# Patient Record
Sex: Female | Born: 1965 | Race: White | Hispanic: No | Marital: Married | Smoking: Never smoker
Health system: Southern US, Community
[De-identification: ages and names within clinical notes are randomized; demographics above are authoritative.]

## PROBLEM LIST (undated history)

## (undated) HISTORY — PX: OTHER SURGICAL HISTORY: SHX169

---

## 2000-09-01 ENCOUNTER — Other Ambulatory Visit: Admission: RE | Admit: 2000-09-01 | Discharge: 2000-09-01 | Payer: Self-pay | Admitting: Gastroenterology

## 2000-09-01 ENCOUNTER — Encounter (INDEPENDENT_AMBULATORY_CARE_PROVIDER_SITE_OTHER): Payer: Self-pay | Admitting: Specialist

## 2001-07-21 ENCOUNTER — Ambulatory Visit (HOSPITAL_COMMUNITY): Admission: RE | Admit: 2001-07-21 | Discharge: 2001-07-21 | Payer: Self-pay | Admitting: Obstetrics and Gynecology

## 2001-07-21 ENCOUNTER — Encounter: Payer: Self-pay | Admitting: Obstetrics and Gynecology

## 2004-02-17 ENCOUNTER — Emergency Department (HOSPITAL_COMMUNITY): Admission: EM | Admit: 2004-02-17 | Discharge: 2004-02-17 | Payer: Self-pay | Admitting: Emergency Medicine

## 2005-08-18 ENCOUNTER — Ambulatory Visit (HOSPITAL_COMMUNITY): Admission: RE | Admit: 2005-08-18 | Discharge: 2005-08-18 | Payer: Self-pay | Admitting: Obstetrics and Gynecology

## 2006-11-11 ENCOUNTER — Ambulatory Visit (HOSPITAL_COMMUNITY): Admission: RE | Admit: 2006-11-11 | Discharge: 2006-11-11 | Payer: Self-pay | Admitting: Obstetrics and Gynecology

## 2008-01-20 ENCOUNTER — Other Ambulatory Visit: Admission: RE | Admit: 2008-01-20 | Discharge: 2008-01-20 | Payer: Self-pay | Admitting: Obstetrics and Gynecology

## 2008-01-24 ENCOUNTER — Ambulatory Visit (HOSPITAL_COMMUNITY): Admission: RE | Admit: 2008-01-24 | Discharge: 2008-01-24 | Payer: Self-pay | Admitting: Obstetrics and Gynecology

## 2008-05-23 ENCOUNTER — Ambulatory Visit (HOSPITAL_COMMUNITY): Admission: RE | Admit: 2008-05-23 | Discharge: 2008-05-23 | Payer: Self-pay | Admitting: Obstetrics & Gynecology

## 2008-07-04 ENCOUNTER — Ambulatory Visit (HOSPITAL_COMMUNITY): Admission: RE | Admit: 2008-07-04 | Discharge: 2008-07-04 | Payer: Self-pay | Admitting: Obstetrics & Gynecology

## 2008-10-05 ENCOUNTER — Encounter: Payer: Self-pay | Admitting: Obstetrics & Gynecology

## 2008-10-05 ENCOUNTER — Ambulatory Visit: Payer: Self-pay | Admitting: Obstetrics and Gynecology

## 2008-10-05 ENCOUNTER — Inpatient Hospital Stay (HOSPITAL_COMMUNITY): Admission: RE | Admit: 2008-10-05 | Discharge: 2008-10-07 | Payer: Self-pay | Admitting: Obstetrics & Gynecology

## 2010-03-11 ENCOUNTER — Encounter: Payer: Self-pay | Admitting: Obstetrics & Gynecology

## 2010-05-25 LAB — URINALYSIS, ROUTINE W REFLEX MICROSCOPIC
Glucose, UA: NEGATIVE mg/dL
pH: 7 (ref 5.0–8.0)

## 2010-05-25 LAB — BASIC METABOLIC PANEL
CO2: 22 mEq/L (ref 19–32)
Chloride: 110 mEq/L (ref 96–112)
GFR calc non Af Amer: >60 mL/min (ref >60)
Glucose, Bld: 119 mg/dL — ABNORMAL HIGH (ref 70–99)
Potassium: 4.2 mEq/L (ref 3.5–5.1)
Sodium: 140 mEq/L (ref 135–145)

## 2010-05-25 LAB — CBC
HCT: 31 % — ABNORMAL LOW (ref 36.0–46.0)
HCT: 39.5 % (ref 36.0–46.0)
Hemoglobin: 10.4 g/dL — ABNORMAL LOW (ref 12.0–15.0)
Hemoglobin: 13.1 g/dL (ref 12.0–15.0)
RBC: 3.51 MIL/uL — ABNORMAL LOW (ref 3.87–5.11)
RBC: 4.42 MIL/uL (ref 3.87–5.11)
RDW: 14.4 % (ref 11.5–15.5)
WBC: 8.7 10*3/uL (ref 4.0–10.5)

## 2010-06-08 IMAGING — US US OB DETAIL+14 WK
1 series · 14 of 28 positions shown · non-contrast
Comparison: none

OBSTETRICAL ULTRASOUND:
 This ultrasound was performed in The [HOSPITAL], and the [facility] report will be stored to [REDACTED] PACS.

[Series 1: us ob detail+14 wk · 14 of 108 slices shown]
[im 4/108]
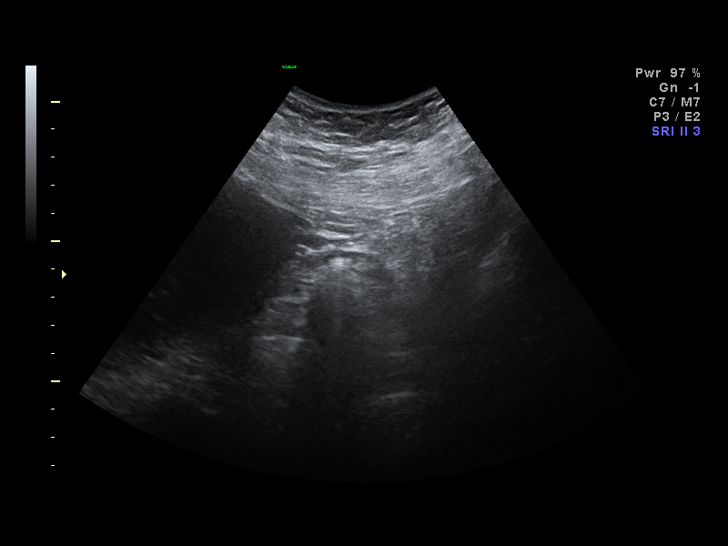
[im 12/108]
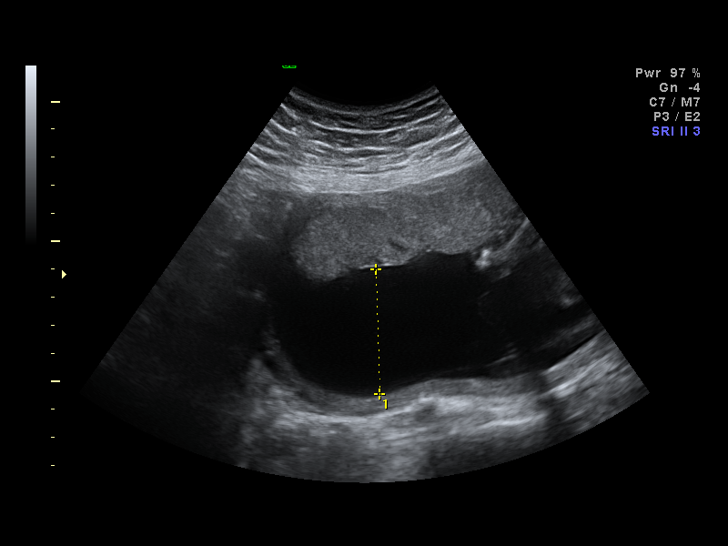
[im 20/108]
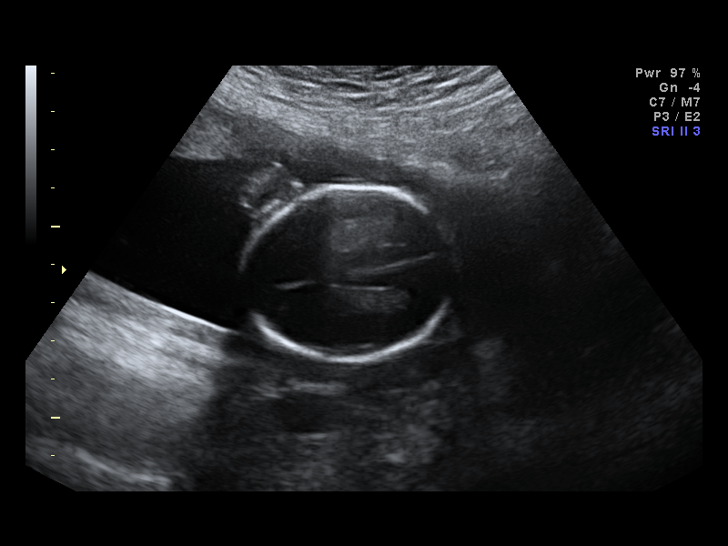
[im 28/108]
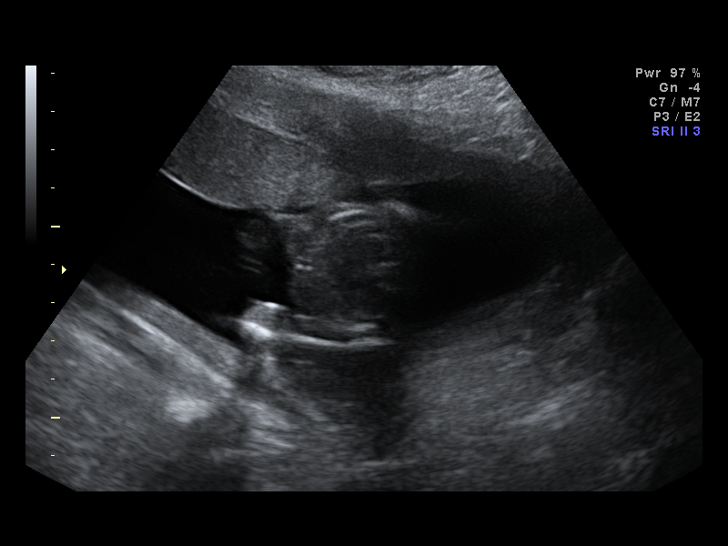
[im 36/108]
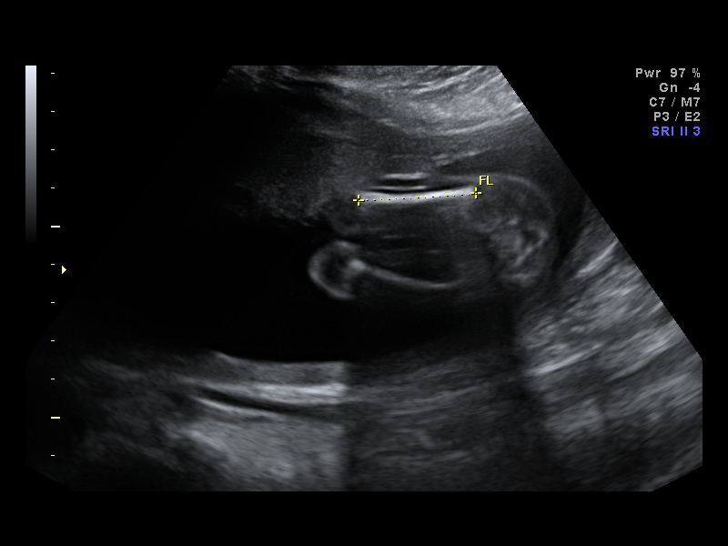
[im 44/108]
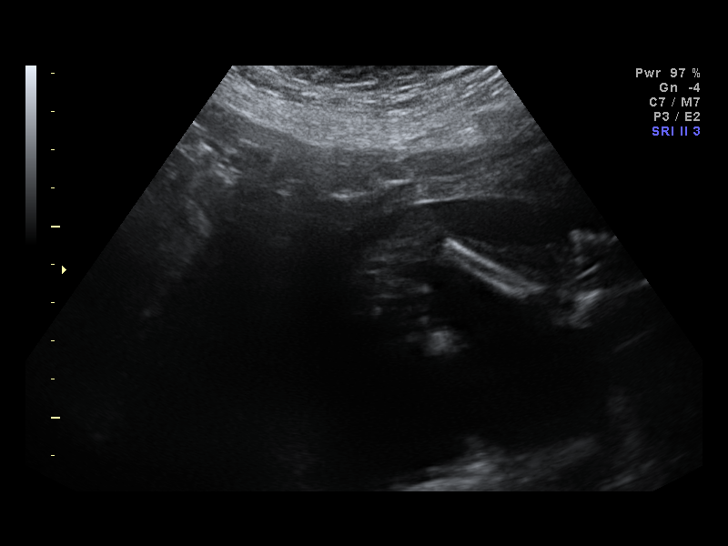
[im 52/108]
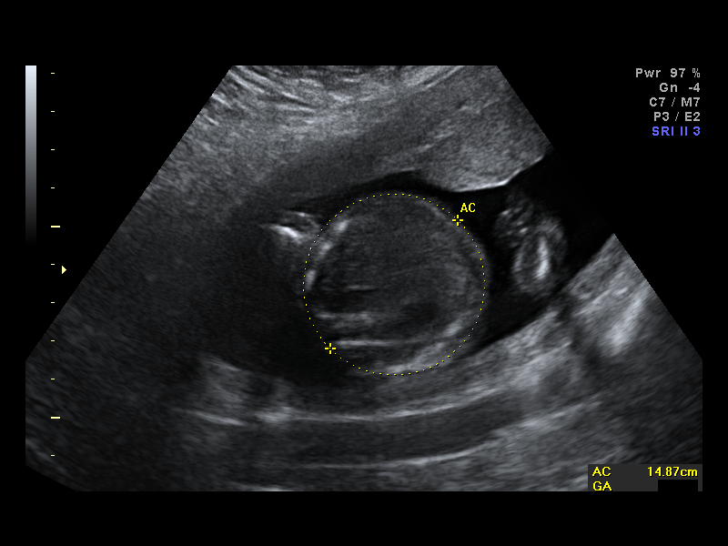
[im 60/108]
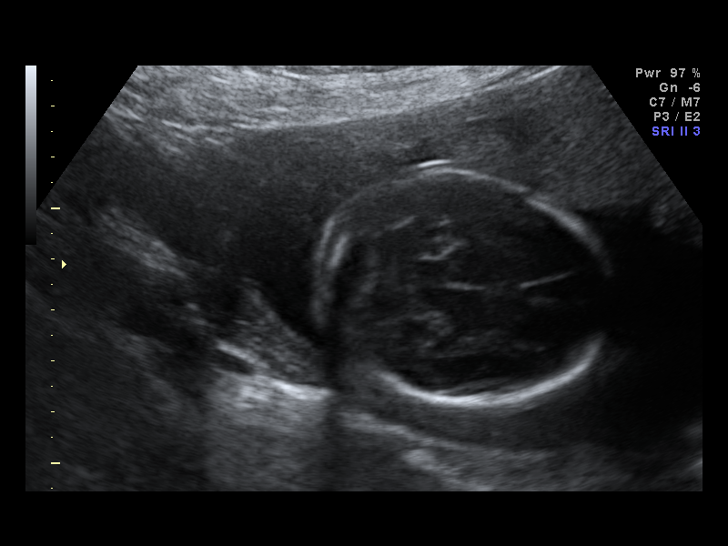
[im 68/108]
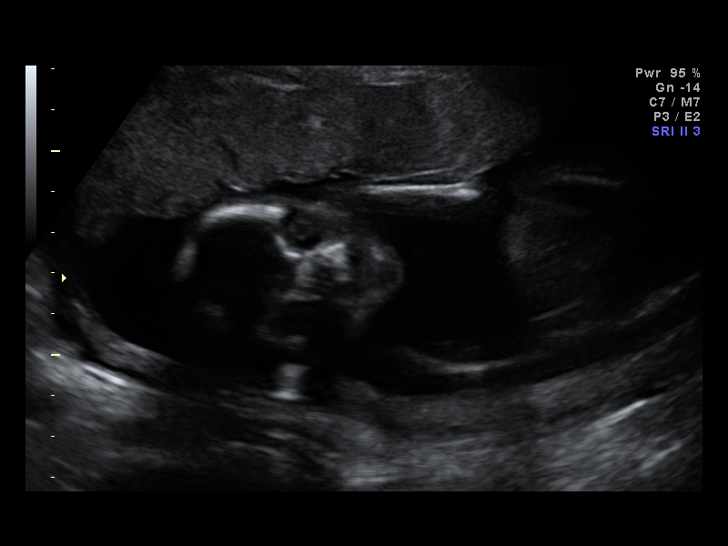
[im 76/108]
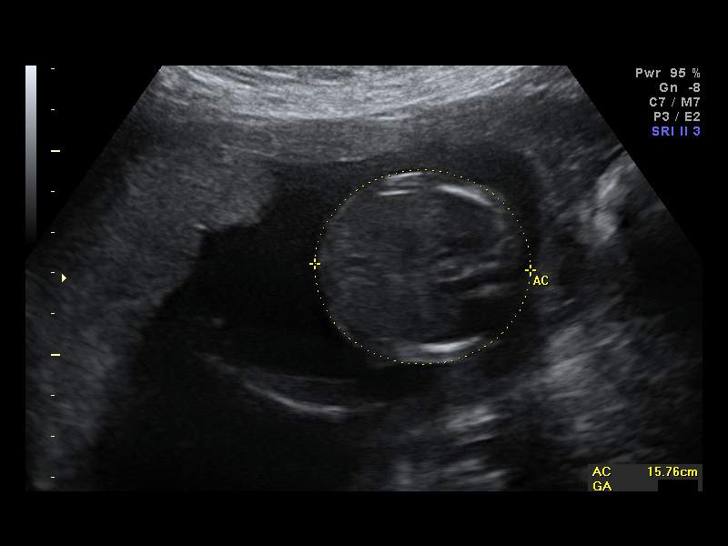
[im 84/108]
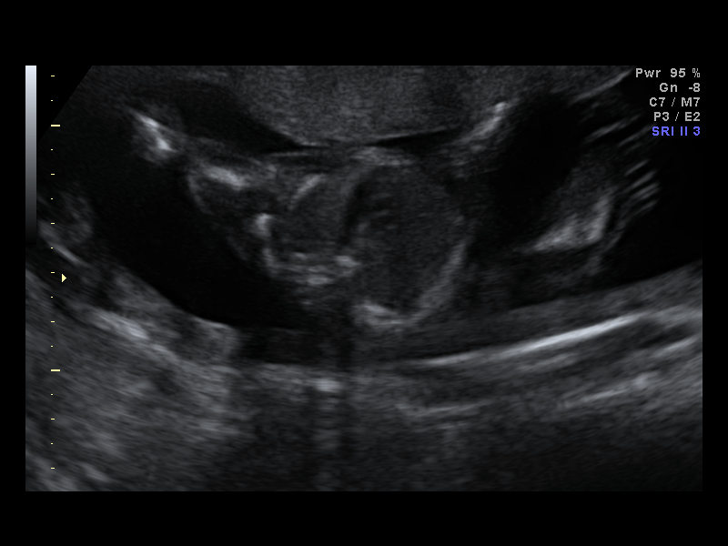
[im 92/108]
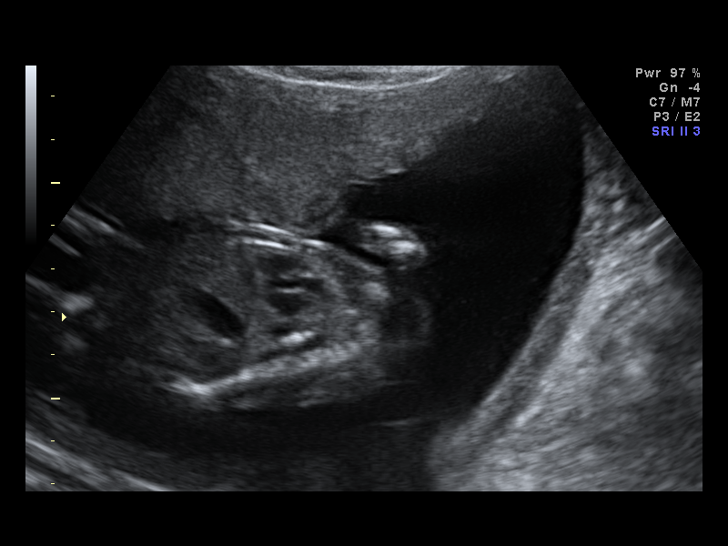
[im 100/108]
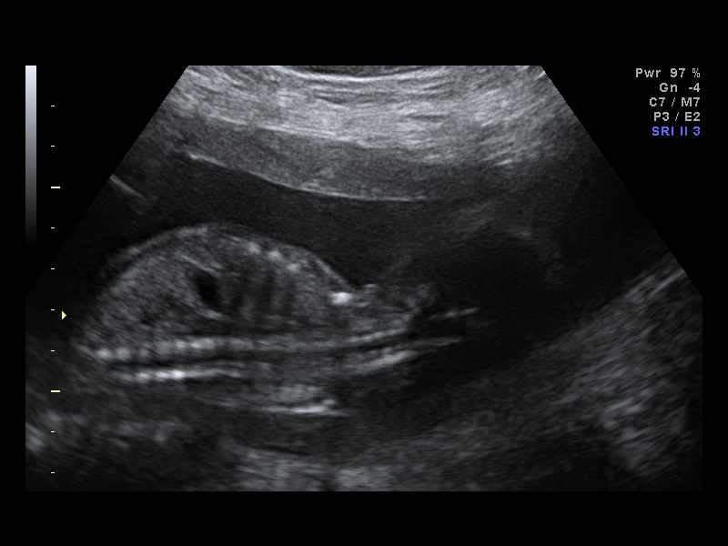
[im 108/108]
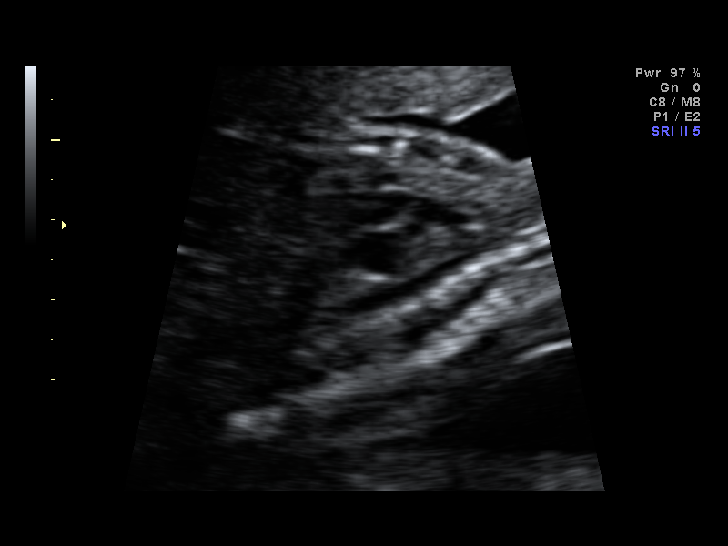

[14 of 28 positions shown; findings below may reference images not displayed]

IMPRESSION: [facility] has also been faxed to the ordering physician.

## 2010-07-02 NOTE — Op Note (Signed)
[name], ROSTAD              ACCOUNT NO.:  [id number]   MEDICAL RECORD NO.:  [id number]          PATIENT TYPE:  INP   LOCATION:  9129                          FACILITY:  [facility]   PHYSICIAN:  Lazaro Arms, M.D.   DATE OF BIRTH:  Jul 07, 1965   DATE OF PROCEDURE:  10/05/2008  DATE OF DISCHARGE:                               OPERATIVE REPORT   PREOPERATIVE DIAGNOSES:  1. Intrauterine pregnancy at 63 weeks' gestation.  2. Previous cesarean section x2.  3. Desires sterilization.  4. Class A1 diabetes mellitus.  5. Crohn disease.   POSTOPERATIVE DIAGNOSES:  1. Intrauterine pregnancy at 71 weeks' gestation.  2. Previous cesarean section x2.  3. Desires sterilization.  4. Class A1 diabetes mellitus.  5. Crohn disease.   PROCEDURES:  Repeat cesarean section and modified Pomeroy bilateral  ligation.   SURGEON:  Lazaro Arms, MD   ANESTHESIA:  Spinal.   FINDINGS:  Over low-transverse hysterotomy incision, delivered a viable  female at 74 with Apgars of 9 and 9 with pH 7.20.  She did have a lot of  adhesions of the uterus.  The anterior wall and the bladder was a very  high up on the lower uterine segment, it was dissected down without  difficulty, and the infant underwent a vacuum-assisted extraction for  delivery.  Cord blood and cord gas sent to Pathology.   DESCRIPTION OF OPERATION:  The patient was taken to the operating room,  placed in sitting position, underwent spinal anesthetic, prepped and  draped in usual sterile fashion.  A Foley catheter was placed.  Pfannenstiel skin incision was made, carried down sharply to the rectus  fascia, which was scored in midline, extended laterally.  The fascia  taken off the muscle superiorly and inferiorly without difficulty.  The  muscles were divided.  Peritoneal cavity was entered.  I had to dissect  the uterus off the perineum with the anterior abdominal wall and also  dissect the bladder down off the lower uterine segment and  anterior  abdominal wall, I had to make high uterine incision, did not have really  any development at all.  Vacuum extractor was used to facilitate  delivery.  The infant underwent routine neonatal resuscitation.  Cord  blood was sent.  Cord gas 7.20.  Placenta was delivered spontaneously.  The uterus was exteriorized, closed in 2 layers, first being a running  interlocking layer and the second being imbricating layer.  There was  good hemostasis.  A modified Pomeroy bilateral tubal ligation was  performed bilaterally and 2-cm segments were removed bilaterally with  good hemostasis.  The uterus was replaced in the peritoneal cavity.  The  muscles were reapproximated loosely.  The fascia closed using 0-Vicryl  running.  Skin was closed using skin staples.  The patient tolerated the  procedure well.  She experienced 500 mL of blood loss, taken recovery  room in stable condition.  All counts correct x3.      Lazaro Arms, M.D.  Electronically Signed     LHE/MEDQ  D:  10/05/2008  T:  10/05/2008  Job:  [id number] 

## 2010-07-02 NOTE — H&P (Signed)
[name], [name]              ACCOUNT NO.:  [id number]   MEDICAL RECORD NO.:  [id number]          PATIENT TYPE:  INP   LOCATION:  9129                          FACILITY:  [facility]   PHYSICIAN:  Lazaro Arms, M.D.   DATE OF BIRTH:  January 07, 1966   DATE OF ADMISSION:  10/05/2008  DATE OF DISCHARGE:                              HISTORY & PHYSICAL   HISTORY OF PRESENT ILLNESS:  The patient is a 45 year old white female,  gravida 3, para 2, estimated date of delivery of October 12, 2008,  currently at 65 weeks' gestation with class A2 diabetes mellitus,  advanced sternal age and desires sterilization.  She is admitted for  repeat cesarean section and bilateral tubal ligation.  The pregnancy  besides her diabetes has been uncomplicated and she has had good blood  sugar control for the most part and has actually had minimal weight gain  when she got on her diet.   PAST MEDICAL HISTORY:  Significant for ulcerative colitis.   PAST SURGICAL HISTORY:  C-section x2.  She has some plates and screws  for traumatic tib-fib fracture   PAST OB HISTORY:  C-section x2.   ALLERGIES:  PENICILLIN.   MEDICATION:  Sulfasalazine.   REVIEW OF SYSTEMS:  Otherwise negative.   PHYSICAL EXAMINATION:  HEENT: Unremarkable.  THYROID:  Normal.  LUNGS:  Clear.  HEART:  Regular rhythm.  No murmurs, rub, or gallop.  BREASTS:  Deferred.  ABDOMEN:  Fundal height of 38 cm.  Cervix long, thick, and closed.  EXTREMITIES:  Warm.  No edema.  NEUROLOGIC:  Grossly intact.   Blood type is A+.  Rubella is immune.  Varicella is immune.  Hepatitis B  was negative.  HIV is negative x2.  HSV-2 is negative.  Serology is  negative x2.  Pap was normal.  GC and Chlamydia was negative x2.  Her  group B strep was negative.  Her 3-hour GTT was abnormal.   IMPRESSION:  1. Intrauterine pregnancy, [redacted] weeks gestation.  2. Previous cesarean section x2.  3. Desires sterilization.  4. Class A1 diabetes mellitus.  5. Ulcerative  colitis.   PLAN:  The patient is admitted for repeat cesarean section and tubal  ligation.  Understands the risks, benefits, indications, alternatives,  will proceed.      Lazaro Arms, M.D.  Electronically Signed     LHE/MEDQ  D:  10/05/2008  T:  10/06/2008  Job:  [id number]

## 2010-07-05 NOTE — Discharge Summary (Signed)
[name], [name]              ACCOUNT NO.:  [id number]   MEDICAL RECORD NO.:  [id number]          PATIENT TYPE:  INP   LOCATION:  9129                          [facility]ACILITY:  [facility]   PHYSICIAN:  Tilda Burrow, M.D. DATE OF BIRTH:  Feb 03, 1966   DATE OF ADMISSION:  10/05/2008  DATE OF DISCHARGE:  10/07/2008                               DISCHARGE SUMMARY   PRIMARY CARE PHYSICIAN:  OB/GYN services through [facility].   DISCHARGE DIAGNOSES:  1. Intrauterine pregnancy at 27 weeks' gestation.  2. Previous cesarean section x2.  3. Desire for sterilization.  4. Class A1 diabetes mellitus.  5. Ulcerative colitis.   DISCHARGE MEDICATIONS:  Sulfasalazine.   CONSULTANTS:  None.   PROCEDURES:  Repeat C-section and modified Pomeroy bilateral ligation.   LABORATORY DATA:  1. CBC, October 04, 2008:  White count 8.7, hemoglobin 13.1, hematocrit      39.5, platelet count 180.  2. Urinalysis, October 04, 2008, showing yellow color, clear      appearance.  Specific gravity 1.020, pH of 7,  urine glucose      negative, bilirubin negative, ketone negative, blood negative,      protein negative, urobilinogen at 0.2, nitrite negative, leukocyte      negative.  3. BMET, October 04, 2008, showing a sodium 140, potassium 4.2,      chloride 110, CO2 22, BUN of 5, creatinine 0.68, blood glucose of      119, calcium of 8.9.  4. RPR October 04, 2008, nonreactive.  5. CBC, October 06, 2008, showing a white count of 10.1, hemoglobin of      10.4, hematocrit 31, platelet count of 153.   BRIEF HOSPITAL COURSE:  Briefly this is a 45 year old G3, P2, coming in  at 79 weeks' gestation with class A2 diabetes as well as advanced  maternal age coming in for C-section as well as tubal ligation.  1. Intrauterine pregnancy. The patient delivered a viable female at 0913      hours on August 19 via lower transverse hysterotomy incision with      Apgars of 9 and 9 with a pH of 7.2.  Delivery was performed via      vacuum  extractor assistance  The fascial incision was closed via 0      Vicryl with the skin being closed with skin staples.  The patient      experienced about 500 ml of blood loss.  The patient is being taken      to the recovery room in stable condition.  Mom will be bottle-      feeding/  For contraception mother received a bilateral tubal      ligation during this hospitalization.  GBS negative.  Blood type      A+, rubella immune.  Varicella immune.  Hepatitis B negative, HIV      negative x2.  HSV negative.  Serology negative x2.  GC/Chlamydia      negative x2.  Group B Strep negative.  2. Ulcerative colitis.  The patient was continued on sulfasalazine      throughout hospitalization with  no reports of ulcerative colitis or      inflammatory bowel disease type symptomatology or related abdominal      pain during this hospitalization.   DISCHARGE INSTRUCTIONS:  1. The patient is discharged home with routine care.  Skin staples      were removed on the day of discharge.  Routine discharge      instructions.  2. Follow-up appointments.  The patient will return to [facility] in      4-6 weeks for routine postpartum follow-up.   CONDITION ON DISCHARGE:  The patient is discharged home with infant in  stable medical condition.      Doree Albee, MD      Tilda Burrow, M.D.  Electronically Signed    SN/MEDQ  D:  10/08/2008  T:  10/08/2008  Job:  [id number]

## 2010-07-05 NOTE — Group Therapy Note (Signed)
[name], [name]              ACCOUNT NO.:  [id number]   MEDICAL RECORD NO.:  [id number]          PATIENT TYPE:  EMS   LOCATION:  ED                            FACILITY:  [facility]   PHYSICIAN:  Kingsley Callander. Ouida Sills, MD       DATE OF BIRTH:  [birth year]   DATE OF PROCEDURE:  02/18/2004  DATE OF DISCHARGE:  02/17/2004                                   PROGRESS NOTE   This patient is a 45 year old white female who presented with a sore throat,  hoarseness and discomfort on swallowing.  She had been seen in the emergency  room yesterday and had been treated with Zithromax and hydrocodone.  She had  been called in viscous lidocaine earlier today to help relieve her symptoms.  She has a history of allergies and asthma, treated with Pulmicort.   HEENT:  On exam her pharynx appears normal.  The nose is mildly congested.  TMs are normal.  NECK:  No cervical adenopathy.  LUNGS:  Clear.  No wheezes.  HEART:  Regular at 108.  RESPIRATORY:  She is breathing comfortably and speaking with a hoarse voice.   IMPRESSION:  Upper respiratory infection.  She does not appear to require  admission at this point.  I believe she can continue with her oral  outpatient therapy.  She has had difficulty sleeping, so Ambien 10 mg q.h.s.  will be added.  She was reassured that with her ability to breathe this well  and handle her secretions this well, she does not require IV therapy.  Her  oxygen saturation was normal yesterday at 99%.  She does not show evidence  of stridor or possible epiglottitis.     Channing Mutters   ROF/MEDQ  D:  02/18/2004  T:  02/18/2004  Job:  [id number]

## 2010-07-25 ENCOUNTER — Other Ambulatory Visit (HOSPITAL_COMMUNITY): Payer: Self-pay | Admitting: Pediatrics

## 2010-07-29 ENCOUNTER — Ambulatory Visit (HOSPITAL_COMMUNITY)
Admission: RE | Admit: 2010-07-29 | Discharge: 2010-07-29 | Disposition: A | Discharge status: Home / Self Care | Payer: BC Managed Care – PPO | Source: Ambulatory Visit | Attending: Pediatrics | Admitting: Pediatrics

## 2010-07-29 DIAGNOSIS — R222 Localized swelling, mass and lump, trunk: Secondary | ICD-10-CM | POA: Insufficient documentation

## 2010-08-12 ENCOUNTER — Telehealth: Payer: Self-pay | Admitting: *Deleted

## 2010-08-12 NOTE — Telephone Encounter (Signed)
Wrong pt

## 2010-08-22 ENCOUNTER — Other Ambulatory Visit: Payer: Self-pay | Admitting: Adult Health

## 2010-08-22 ENCOUNTER — Other Ambulatory Visit (HOSPITAL_COMMUNITY)
Admission: RE | Admit: 2010-08-22 | Discharge: 2010-08-22 | Disposition: A | Discharge status: Home / Self Care | Payer: BC Managed Care – PPO | Source: Ambulatory Visit | Attending: Obstetrics and Gynecology | Admitting: Obstetrics and Gynecology

## 2010-08-22 DIAGNOSIS — Z1159 Encounter for screening for other viral diseases: Secondary | ICD-10-CM | POA: Insufficient documentation

## 2010-08-22 DIAGNOSIS — Z01419 Encounter for gynecological examination (general) (routine) without abnormal findings: Secondary | ICD-10-CM | POA: Insufficient documentation

## 2010-12-25 ENCOUNTER — Encounter: Payer: Self-pay | Admitting: Nephrology

## 2011-01-07 ENCOUNTER — Other Ambulatory Visit (INDEPENDENT_AMBULATORY_CARE_PROVIDER_SITE_OTHER): Payer: Self-pay | Admitting: Internal Medicine

## 2011-01-21 NOTE — Telephone Encounter (Signed)
Per Dr. Karilyn Cota, this is not our patient. DMM

## 2011-01-21 NOTE — Telephone Encounter (Signed)
We do not  have any records on this patient. I spoke with pharmacist and patient had a prescription filled by Ms. Lowell Guitar from [facility]

## 2011-01-21 NOTE — Telephone Encounter (Signed)
Needs OV. I am unable to locate chart

## 2011-02-14 ENCOUNTER — Encounter (INDEPENDENT_AMBULATORY_CARE_PROVIDER_SITE_OTHER): Payer: Self-pay | Admitting: *Deleted

## 2011-02-26 ENCOUNTER — Ambulatory Visit (INDEPENDENT_AMBULATORY_CARE_PROVIDER_SITE_OTHER): Payer: Self-pay | Admitting: Internal Medicine

## 2011-02-26 ENCOUNTER — Telehealth (INDEPENDENT_AMBULATORY_CARE_PROVIDER_SITE_OTHER): Payer: Self-pay | Admitting: *Deleted

## 2011-02-26 ENCOUNTER — Encounter (INDEPENDENT_AMBULATORY_CARE_PROVIDER_SITE_OTHER): Payer: Self-pay | Admitting: Internal Medicine

## 2011-02-26 ENCOUNTER — Other Ambulatory Visit (INDEPENDENT_AMBULATORY_CARE_PROVIDER_SITE_OTHER): Payer: Self-pay | Admitting: *Deleted

## 2011-02-26 VITALS — BP 124/80 | HR 80 | Temp 98.4°F | Ht 64.0 in | Wt 221.0 lb

## 2011-02-26 DIAGNOSIS — K5120 Ulcerative (chronic) proctitis without complications: Secondary | ICD-10-CM

## 2011-02-26 NOTE — Patient Instructions (Signed)
Colonoscopy with Dr. Rehman 

## 2011-02-26 NOTE — Telephone Encounter (Signed)
Patient needs movi prep 

## 2011-02-26 NOTE — Progress Notes (Signed)
Subjective:     Patient ID: [name], female   DOB: 06-26-1965, 46 y.o.   MRN: [id number]  HPI [name] is a 46 yr old female here today for f/u of her UC. She was diagnosed with 2003 with UC on colonoscopy by Dr. Corinda Gubler. She had bloody stools and crampy abdominal pain and mucous.  Her last UC flare was this fall which was minor. She did have blood in her stool.  She was seen in the [place] office for her UC. Appetite is good. No unintentional weight loss.  No abdominal pain. No melena. She occasionally has blood in her stool.  02/05/2011 11.7 and 37.4. Review of Systems see hpi Current Outpatient Prescriptions  Medication Sig Dispense Refill  . sulfaSALAzine (AZULFIDINE) 500 MG tablet TAKE 2 TABLETS THREE TIMES A DAY  180 tablet  10   Past Medical History  Diagnosis Date  . Ulcerative colitis     diagnosed [id number]   Past Surgical History  Procedure Date  . Bladder repair w/ cesarean section     x 3   . Tib/fib repair from trauma    Family Status  Relation Status Death Age  . Mother Alive     good health  . Father Alive     good health  . Brother Alive     good health   History   Social History  . Marital Status: Married    Spouse Name: N/A    Number of Children: N/A  . Years of Education: N/A   Occupational History  . Not on file.   Social History Main Topics  . Smoking status: Never Smoker   . Smokeless tobacco: Not on file  . Alcohol Use: No  . Drug Use: No  . Sexually Active: Not on file   Other Topics Concern  . Not on file   Social History Narrative  . No narrative on file   Not on File     Objective:   Physical Exam Filed Vitals:   02/26/11 1018  Height: 5\' 4"  (1.626 m)  Weight: 221 lb (100.245 kg)   Alert and oriented. Skin warm and dry. Oral mucosa is moist.   . Sclera anicteric, conjunctivae is pink. Thyroid not enlarged. No cervical lymphadenopathy. Lungs clear. Heart regular rate and rhythm.  Abdomen is soft. Bowel sounds are positive.  No hepatomegaly. No abdominal masses felt. No tenderness.  No edema to lower extremities. Patient is alert and oriented.     Assessment:    UC which appears to be in remission . Will schedule a surveillance colonoscopy with Dr. Karilyn Cota.  The risks and benefits such as perforation, bleeding, and infection were reviewed with the patient and is agreeable.     Plan:    Surveillance colonoscopy for Crohn's

## 2011-02-27 MED ORDER — PEG-KCL-NACL-NASULF-NA ASC-C 100 G PO SOLR: 1.0000 | Freq: Once | ORAL | Status: DC

## 2011-03-14 ENCOUNTER — Encounter (HOSPITAL_COMMUNITY): Payer: Self-pay | Admitting: Pharmacy Technician

## 2011-04-08 ENCOUNTER — Encounter (INDEPENDENT_AMBULATORY_CARE_PROVIDER_SITE_OTHER): Payer: Self-pay | Admitting: *Deleted

## 2011-04-08 ENCOUNTER — Other Ambulatory Visit (INDEPENDENT_AMBULATORY_CARE_PROVIDER_SITE_OTHER): Payer: Self-pay | Admitting: *Deleted

## 2011-04-08 DIAGNOSIS — K5190 Ulcerative colitis, unspecified, without complications: Secondary | ICD-10-CM

## 2011-04-09 ENCOUNTER — Ambulatory Visit (HOSPITAL_COMMUNITY)
Admission: RE | Admit: 2011-04-09 | Payer: BC Managed Care – PPO | Source: Ambulatory Visit | Admitting: Internal Medicine

## 2011-04-09 ENCOUNTER — Encounter (HOSPITAL_COMMUNITY): Admission: RE | Payer: Self-pay | Source: Ambulatory Visit

## 2011-04-09 SURGERY — COLONOSCOPY
Anesthesia: Moderate Sedation

## 2011-05-13 ENCOUNTER — Telehealth (INDEPENDENT_AMBULATORY_CARE_PROVIDER_SITE_OTHER): Payer: Self-pay | Admitting: *Deleted

## 2011-05-13 ENCOUNTER — Encounter (INDEPENDENT_AMBULATORY_CARE_PROVIDER_SITE_OTHER): Payer: Self-pay | Admitting: *Deleted

## 2011-05-13 MED ORDER — PEG-KCL-NACL-NASULF-NA ASC-C 100 G PO SOLR: 1.0000 | Freq: Once | ORAL | Status: DC

## 2011-05-13 MED ORDER — SODIUM CHLORIDE 0.45 % IV SOLN
Freq: Once | INTRAVENOUS | Status: AC
  Administered 2011-06-13: 09:00:00 via INTRAVENOUS

## 2011-05-13 NOTE — Telephone Encounter (Signed)
Patient needs movi prep 

## 2011-06-13 ENCOUNTER — Encounter (HOSPITAL_COMMUNITY): Payer: Self-pay

## 2011-06-13 ENCOUNTER — Ambulatory Visit (HOSPITAL_COMMUNITY)
Admission: RE | Admit: 2011-06-13 | Discharge: 2011-06-13 | Disposition: A | Discharge status: Home Health | Payer: BC Managed Care – PPO | Source: Ambulatory Visit | Attending: Internal Medicine | Admitting: Internal Medicine

## 2011-06-13 ENCOUNTER — Encounter (HOSPITAL_COMMUNITY)
Admission: RE | Disposition: A | Discharge status: Home Health | Payer: Self-pay | Source: Ambulatory Visit | Attending: Internal Medicine

## 2011-06-13 DIAGNOSIS — K5190 Ulcerative colitis, unspecified, without complications: Secondary | ICD-10-CM

## 2011-06-13 DIAGNOSIS — K644 Residual hemorrhoidal skin tags: Secondary | ICD-10-CM | POA: Insufficient documentation

## 2011-06-13 DIAGNOSIS — Z1211 Encounter for screening for malignant neoplasm of colon: Secondary | ICD-10-CM

## 2011-06-13 DIAGNOSIS — Z09 Encounter for follow-up examination after completed treatment for conditions other than malignant neoplasm: Secondary | ICD-10-CM | POA: Insufficient documentation

## 2011-06-13 HISTORY — PX: COLONOSCOPY: SHX5424

## 2011-06-13 SURGERY — COLONOSCOPY
Anesthesia: Moderate Sedation

## 2011-06-13 MED ORDER — MEPERIDINE HCL 50 MG/ML IJ SOLN
INTRAMUSCULAR | Status: DC | PRN
  Administered 2011-06-13 (×2): 25 mg via INTRAVENOUS

## 2011-06-13 MED ORDER — MIDAZOLAM HCL 5 MG/5ML IJ SOLN
INTRAMUSCULAR | Status: AC
  Filled 2011-06-13: qty 10

## 2011-06-13 MED ORDER — MIDAZOLAM HCL 5 MG/5ML IJ SOLN
INTRAMUSCULAR | Status: DC | PRN
  Administered 2011-06-13: 1 mg via INTRAVENOUS
  Administered 2011-06-13 (×3): 2 mg via INTRAVENOUS
  Administered 2011-06-13: 1 mg via INTRAVENOUS
  Administered 2011-06-13: 2 mg via INTRAVENOUS

## 2011-06-13 MED ORDER — SULFASALAZINE 500 MG PO TABS: 1000.0000 mg | ORAL_TABLET | Freq: Two times a day (BID) | ORAL | Status: DC

## 2011-06-13 MED ORDER — STERILE WATER FOR IRRIGATION IR SOLN
Status: DC | PRN
  Administered 2011-06-13: 09:00:00

## 2011-06-13 MED ORDER — MEPERIDINE HCL 50 MG/ML IJ SOLN
INTRAMUSCULAR | Status: AC
  Filled 2011-06-13: qty 1

## 2011-06-13 NOTE — H&P (Signed)
[name] is an 46 y.o. female.   Chief Complaint: Patient here for colonoscopy. HPI:  Patient is a 46 year old Caucasian female who has a 10 year history of ulcerative colitis and is in remission. She is here for surveillance colonoscopy. She generally has 4 bowel movements per day these are formed. No history of rectal bleeding abdominal pain or melena.  Past Medical History  Diagnosis Date  . Ulcerative colitis     diagnosed [id number]    Past Surgical History  Procedure Date  . Bladder repair w/ cesarean section     x 3   . Tib/fib repair from trauma     History reviewed. No pertinent family history. Social History:  reports that she has never smoked. She does not have any smokeless tobacco history on file. She reports that she does not drink alcohol or use illicit drugs.  Allergies:  Allergies  Allergen Reactions  . Penicillins Rash and Other (See Comments)    Childhood allergy    Medications Prior to Admission  Medication Sig Dispense Refill  . Multiple Vitamin (MULITIVITAMIN WITH MINERALS) TABS Take 1 tablet by mouth daily.      . peg 3350 powder (MOVIPREP) SOLR Take 1 kit (100 g total) by mouth once.  1 kit  0  . sulfaSALAzine (AZULFIDINE) 500 MG tablet       . ibuprofen (ADVIL,MOTRIN) 200 MG tablet Take 200 mg by mouth every 6 (six) hours as needed. For pain        No results found for this or any previous visit (from the past 48 hour(s)). No results found.  Review of Systems  Constitutional: Negative for weight loss.  Gastrointestinal: Negative for abdominal pain, diarrhea, constipation, blood in stool and melena.    Blood pressure 133/84, pulse 93, temperature 98.4 F (36.9 C), temperature source Oral, resp. rate 18, height 5\' 4"  (1.626 m), weight 208 lb (94.348 kg), SpO2 96.00%. Physical Exam  Constitutional: She appears well-developed and well-nourished.  HENT:  Mouth/Throat: Oropharynx is clear and moist.  Eyes: Conjunctivae are normal. No scleral  icterus.  Neck: No thyromegaly present.  Cardiovascular: Normal rate, regular rhythm and normal heart sounds.   No murmur heard. Respiratory: Effort normal and breath sounds normal.  GI: Soft. She exhibits no distension and no mass. There is tenderness (mild hypogastric tenderness).  Musculoskeletal: She exhibits no edema.  Lymphadenopathy:    She has no cervical adenopathy.  Neurological: She is alert.  Skin: Skin is warm.     Assessment/Plan Chronic ulcerative colitis. Surveillance colonoscopy  [name] U 06/13/2011, 9:18 AM

## 2011-06-13 NOTE — Op Note (Signed)
COLONOSCOPY PROCEDURE REPORT  PATIENT:  [name]  MR#:  [id number] Birthdate:  12/21/1965, 46 y.o., female Endoscopist:  Dr. Malissa Hippo, MD Referred By:  Dr. Francoise Schaumann. Milford Cage, M.D. Procedure Date: 06/13/2011  Procedure:   Colonoscopy  Indications:  Patient is a 46 year old Caucasian female with over 10 year history of UC who is undergoing surveillance colonoscopy.  Informed Consent:  The procedure and risks were reviewed with the patient and informed consent was obtained.  Medications:  Demerol 50 mg IV Versed 10mg  IV  Description of procedure:  After a digital rectal exam was performed, that colonoscope was advanced from the anus through the rectum and colon to the area of the cecum, ileocecal valve and appendiceal orifice. The cecum was deeply intubated. These structures were well-seen and photographed for the record. From the level of the cecum and ileocecal valve, the scope was slowly and cautiously withdrawn. The mucosal surfaces were carefully surveyed utilizing scope tip to flexion to facilitate fold flattening as needed. The scope was pulled down into the rectum where a thorough exam including retroflexion was performed. Terminal ileum was also examined.  Findings:   Prep satisfactory. Normal terminal ileum. Normal mucosa of cecum and ascending colon. Patchy areas of erythema erosions and some friability at the transverse, descending and sigmoid colon. Normal rectal mucosa. Small hemorrhoids below the dentate line and single anal papilla.  Therapeutic/Diagnostic Maneuvers Performed:  Biopsy taken from transverse and descending colon.  Complications:  None  Cecal Withdrawal Time:  15 minutes  Impression:  Normal terminal ileum. Patchy areas of mild colitis at transverse, descending and sigmoid colon. Cecal, ascending colon and rectal mucosa was normal. Biopsy taken from mucosa of transverse and descending colon.  Recommendations:  Increase sulfasalazine to 1  g by mouth twice a day. I will contact patient with results of biopsy and further recommendations.  Callahan Wild U  06/13/2011 10:06 AM  CC: Dr. Vivia Ewing, MD, MD & Dr. Bonnetta Barry ref. provider found

## 2011-06-13 NOTE — Discharge Instructions (Signed)
Increase sulfasalazine to 1 g by mouth twice a day. Resume other medications as before. No driving for 24 hours. Physician to contact you with the biopsy results.  Colonoscopy Care After Read the instructions outlined below and refer to this sheet in the next few weeks. These discharge instructions provide you with general information on caring for yourself after you leave the hospital. Your doctor may also give you specific instructions. While your treatment has been planned according to the most current medical practices available, unavoidable complications occasionally occur. If you have any problems or questions after discharge, call your doctor. HOME CARE INSTRUCTIONS ACTIVITY:  You may resume your regular activity, but move at a slower pace for the next 24 hours.   Take frequent rest periods for the next 24 hours.   Walking will help get rid of the air and reduce the bloated feeling in your belly (abdomen).   No driving for 24 hours (because of the medicine (anesthesia) used during the test).   You may shower.   Do not sign any important legal documents or operate any machinery for 24 hours (because of the anesthesia used during the test).  NUTRITION:  Drink plenty of fluids.   You may resume your normal diet as instructed by your doctor.   Begin with a light meal and progress to your normal diet. Heavy or fried foods are harder to digest and may make you feel sick to your stomach (nauseated).   Avoid alcoholic beverages for 24 hours or as instructed.  MEDICATIONS:  You may resume your normal medications unless your doctor tells you otherwise.  WHAT TO EXPECT TODAY:  Some feelings of bloating in the abdomen.   Passage of more gas than usual.   Spotting of blood in your stool or on the toilet paper.  IF YOU HAD POLYPS REMOVED DURING THE COLONOSCOPY:  No aspirin products for 7 days or as instructed.   No alcohol for 7 days or as instructed.   Eat a soft diet for the  next 24 hours.  FINDING OUT THE RESULTS OF YOUR TEST Not all test results are available during your visit. If your test results are not back during the visit, make an appointment with your caregiver to find out the results. Do not assume everything is normal if you have not heard from your caregiver or the medical facility. It is important for you to follow up on all of your test results.  SEEK IMMEDIATE MEDICAL CARE IF:  You have more than a spotting of blood in your stool.   Your belly is swollen (abdominal distention).   You are nauseated or vomiting.   You have a fever.   You have abdominal pain or discomfort that is severe or gets worse throughout the day.  Document Released: 09/18/2003 Document Revised: 01/23/2011 Document Reviewed: 09/16/2007 [facility] Patient Information 2012 [place], [place].  Colitis Colitis is inflammation of the colon. Colitis can be a short-term or long-standing (chronic) illness. Crohn's disease and ulcerative colitis are 2 types of colitis which are chronic. They usually require lifelong treatment. CAUSES  There are many different causes of colitis, including:  Viruses.   Germs (bacteria).   Medicine reactions.  SYMPTOMS   Diarrhea.   Intestinal bleeding.   Pain.   Fever.   Throwing up (vomiting).   Tiredness (fatigue).   Weight loss.   Bowel blockage.  DIAGNOSIS  The diagnosis of colitis is based on examination and stool or blood tests. X-rays, CT scan, and  colonoscopy may also be needed. TREATMENT  Treatment may include:  Fluids given through the vein (intravenously).   Bowel rest (nothing to eat or drink for a period of time).   Medicine for pain and diarrhea.   Medicines (antibiotics) that kill germs.   Cortisone medicines.   Surgery.  HOME CARE INSTRUCTIONS   Get plenty of rest.   Drink enough water and fluids to keep your urine clear or pale yellow.   Eat a well-balanced diet.   Call your caregiver for follow-up  as recommended.  SEEK IMMEDIATE MEDICAL CARE IF:   You develop chills.   You have an oral temperature above 102 F (38.9 C), not controlled by medicine.   You have extreme weakness, fainting, or dehydration.   You have repeated vomiting.   You develop severe belly (abdominal) pain or are passing bloody or tarry stools.  MAKE SURE YOU:   Understand these instructions.   Will watch your condition.   Will get help right away if you are not doing well or get worse.  Document Released: 03/13/2004 Document Revised: 01/23/2011 Document Reviewed: 06/08/2009 [facility] Patient Information 2012 [place], [place].

## 2011-06-17 ENCOUNTER — Encounter (HOSPITAL_COMMUNITY): Payer: Self-pay | Admitting: Internal Medicine

## 2011-06-18 ENCOUNTER — Other Ambulatory Visit (INDEPENDENT_AMBULATORY_CARE_PROVIDER_SITE_OTHER): Payer: Self-pay | Admitting: Internal Medicine

## 2011-06-18 MED ORDER — SULFASALAZINE 500 MG PO TABS: 1000.0000 mg | ORAL_TABLET | Freq: Three times a day (TID) | ORAL | Status: DC

## 2011-06-30 ENCOUNTER — Encounter (INDEPENDENT_AMBULATORY_CARE_PROVIDER_SITE_OTHER): Payer: Self-pay | Admitting: *Deleted

## 2011-06-30 ENCOUNTER — Encounter (INDEPENDENT_AMBULATORY_CARE_PROVIDER_SITE_OTHER): Payer: Self-pay

## 2011-11-04 ENCOUNTER — Encounter (INDEPENDENT_AMBULATORY_CARE_PROVIDER_SITE_OTHER): Payer: Self-pay | Admitting: Internal Medicine

## 2011-11-04 ENCOUNTER — Ambulatory Visit (INDEPENDENT_AMBULATORY_CARE_PROVIDER_SITE_OTHER): Payer: BC Managed Care – PPO | Admitting: Internal Medicine

## 2011-11-04 VITALS — BP 124/72 | HR 78 | Temp 99.1°F | Resp 20 | Ht 64.0 in | Wt 222.5 lb

## 2011-11-04 DIAGNOSIS — K5190 Ulcerative colitis, unspecified, without complications: Secondary | ICD-10-CM | POA: Insufficient documentation

## 2011-11-04 DIAGNOSIS — D649 Anemia, unspecified: Secondary | ICD-10-CM | POA: Insufficient documentation

## 2011-11-04 LAB — CBC
Hemoglobin: 12.1 g/dL (ref 12.0–15.0)
MCH: 26.1 pg (ref 26.0–34.0)
MCHC: 32.8 g/dL (ref 30.0–36.0)
MCV: 79.5 fL (ref 78.0–100.0)
Platelets: 233 10*3/uL (ref 150–400)
RBC: 4.64 MIL/uL (ref 3.87–5.11)

## 2011-11-04 MED ORDER — FOLIC ACID 1 MG PO TABS: 1.0000 mg | ORAL_TABLET | Freq: Every day | ORAL | Status: DC

## 2011-11-04 NOTE — Progress Notes (Signed)
Presenting complaint;  Followup for ulcerative colitis.  Subjective:  [name] is 46 year old Caucasian female who is here for followup of chronic ulcerative colitis. She was initially diagnosed with colitis 20 years ago when she was living in [place]. She was treated with sulfasalazine for a short time and her symptoms resolved. This was back in 1993. She was not placed on maintenance therapy as she was felt not to have UC. In 2003 she was diagnosed with ulcerative colitis. She presented back in April with abdominal cramps and diarrhea with rectal bleeding. She underwent colonoscopy on 06/13/2011 and noted to have patchy active disease involving transverse, descending and sigmoid colon. Biopsy was consistent with active UC. No dysplasia was identified. Sulfasalazine dose was increased from 2-3 g per day. She has had sporadic diarrhea and abdominal pain without rectal bleeding. She has sporadic regurgitation and heartburn if she does not take sulfasalazine after meals. She is interested in treatment with mesalamine.  Current Medications: Current Outpatient Prescriptions  Medication Sig Dispense Refill  . acetaminophen (TYLENOL) 500 MG tablet Take 500 mg by mouth as needed. Patient states that she uses very rarely for sinus      . ibuprofen (ADVIL,MOTRIN) 200 MG tablet Take 200 mg by mouth every 6 (six) hours as needed. For pain      . Multiple Vitamin (MULITIVITAMIN WITH MINERALS) TABS Take 1 tablet by mouth daily.      Marland Kitchen sulfaSALAzine (AZULFIDINE) 500 MG tablet Take 2 tablets (1,000 mg total) by mouth 3 (three) times daily.  180 tablet  11     Objective: Blood pressure 124/72, pulse 78, temperature 99.1 F (37.3 C), temperature source Oral, resp. rate 20, height 5\' 4"  (1.626 m), weight 222 lb 8 oz (100.925 kg), last menstrual period 10/22/2011. Patient is alert and in no acute distress. Conjunctiva is pink. Sclera is nonicteric Oropharyngeal mucosa is normal. No neck masses or thyromegaly  noted. Cardiac exam with regular rhythm normal S1 and S2. No murmur or gallop noted. Lungs are clear to auscultation. Abdomen is full, soft and nontender without organomegaly or masses.  No LE edema or clubbing noted.    Assessment:  Chronic ulcerative colitis with active disease on recent colonoscopy of 06/13/2011. She is having intermittent nonbloody diarrhea. She may also be having some side effects with sulfasalazine and therefore needs to consider other mesalamine preparations without sulfa moiety. She is familiar with Apriso and we also touched upon other FDA approved mesalamines. Mild anemia with hemoglobin of 11.7 g in December 2012.   Plan:  Continue sulfasalazine at 1 g by mouth 3 times a day. Folate acid 1 mg by mouth daily while on sulfasalazine. CBC and CRP today. Will call patient with results of blood work and further recommendations. OV in 6 months.

## 2011-11-04 NOTE — Patient Instructions (Signed)
Physician will contact you with results of blood work. 

## 2011-11-05 LAB — C-REACTIVE PROTEIN: CRP: <0.5 mg/dL (ref <0.60)

## 2012-01-05 ENCOUNTER — Encounter (INDEPENDENT_AMBULATORY_CARE_PROVIDER_SITE_OTHER): Payer: Self-pay

## 2012-05-04 ENCOUNTER — Encounter (INDEPENDENT_AMBULATORY_CARE_PROVIDER_SITE_OTHER): Payer: Self-pay | Admitting: Internal Medicine

## 2012-05-04 ENCOUNTER — Ambulatory Visit (INDEPENDENT_AMBULATORY_CARE_PROVIDER_SITE_OTHER): Payer: BC Managed Care – PPO | Admitting: Internal Medicine

## 2012-05-04 VITALS — BP 124/70 | HR 76 | Temp 97.7°F | Resp 18 | Ht 64.0 in | Wt 229.4 lb

## 2012-05-04 DIAGNOSIS — K5190 Ulcerative colitis, unspecified, without complications: Secondary | ICD-10-CM

## 2012-05-04 MED ORDER — BUDESONIDE 9 MG PO TB24: 9.0000 mg | ORAL_TABLET | Freq: Every day | ORAL | Status: DC

## 2012-05-04 MED ORDER — MESALAMINE 400 MG PO CPDR: 1200.0000 mg | DELAYED_RELEASE_CAPSULE | Freq: Two times a day (BID) | ORAL | Status: DC

## 2012-05-04 NOTE — Patient Instructions (Signed)
Call with progress report in 2-3 weeks but if he have side effects with Delzicol or Uceris.

## 2012-05-04 NOTE — Progress Notes (Signed)
Presenting complaint;  Followup for ulcerative colitis.  Subjective:  Patient is 47 year old Caucasian female who is symptoms of colitis for over 20 years she was confirmed to have ulcerative colitis in 2003. She has been maintained on sulfasalazine. She underwent colonoscopy in April 2013 and noted to have active disease at transverse, descending and sigmoid colon. Biopsy from the serious was consistent with ulcerated colitis. Following her last visit in September 2013 she was given samples of Apriso. She took this medication for one month but experienced abdominal cramping and bloating. She therefore went back on sulfasalazine. She was doing fine until about 10 days ago when she developed diarrhea with 3-4 bowel movements per day abdominal cramping burning and some rectal bleeding. She also has been bloated. She's not experienced nausea vomiting fever or chills. She has gone on a bland diet but not any better. She stays busy with 71-year-old but hasn't had any stress lately. She eats yogurt with probiotic every day. She did take Z-Pak twice in fall for respiratory tract infection but not recently. She has gained 7 pounds since her last visit.  Current Medications: Current Outpatient Prescriptions  Medication Sig Dispense Refill  . acetaminophen (TYLENOL) 500 MG tablet Take 500 mg by mouth as needed. Patient states that she uses very rarely for sinus      . ibuprofen (ADVIL,MOTRIN) 200 MG tablet Take 200 mg by mouth every 6 (six) hours as needed. For pain      . Multiple Vitamin (MULITIVITAMIN WITH MINERALS) TABS Take 1 tablet by mouth daily.      Marland Kitchen SALINE NA Place into the nose as needed. Patient states that this is called a Sales executive      . sulfaSALAzine (AZULFIDINE) 500 MG tablet Take 2 tablets (1,000 mg total) by mouth 3 (three) times daily.  180 tablet  11  . folic acid (FOLVITE) 1 MG tablet Take 1 tablet (1 mg total) by mouth daily.  100 tablet  3   No current facility-administered  medications for this visit.     Objective: Blood pressure 124/70, pulse 76, temperature 97.7 F (36.5 C), temperature source Oral, resp. rate 18, height 5\' 4"  (1.626 m), weight 229 lb 6.4 oz (104.055 kg), last menstrual period 04/25/2012. Patient is alert and in no acute distress. Conjunctiva is pink. Sclera is nonicteric Oropharyngeal mucosa is normal. No neck masses or thyromegaly noted. Cardiac exam with regular rhythm normal S1 and S2. No murmur or gallop noted. Lungs are clear to auscultation. Abdomen is full. Soft with mild tenderness at LLQ. No organomegaly or masses. No LE edema or clubbing noted.  Labs/studies Results: CBC from 11/04/2011 WBC 6.8, H&H 12.1 and 36.9 and platelet count 233K. CRP less than 0.05   Assessment:  Patient's symptoms are suggestive of relapse of ulcerative colitis. Symptoms are consistent with mild-to-moderate disease. Since her disease activity cannot be controlled with sulfasalazine will try  another oral mesalamine and hopefully she would not experience side effects.   Plan:  Discontinue sulfasalazine and folate acid. Uceris 9 mg by mouth daily; 24 doses given along with prescription for 30 days. Delzicol 1200 mg by mouth twice a day. Samples given along with prescription. Patient will call with progress report in 2-3 weeks. Office visit in 8 weeks.

## 2012-07-05 ENCOUNTER — Telehealth (INDEPENDENT_AMBULATORY_CARE_PROVIDER_SITE_OTHER): Payer: Self-pay | Admitting: *Deleted

## 2012-07-05 NOTE — Telephone Encounter (Signed)
Per [place], she is having to reschedule her f/u apt scheduled for 07/06/12. There is a conflict with her scheduled. Please let Dr. Karilyn Cota know the medications he started her on is working good. The return phone number is [phone].

## 2012-07-05 NOTE — Telephone Encounter (Signed)
Dr.Rehman made aware. 

## 2012-07-06 ENCOUNTER — Ambulatory Visit (INDEPENDENT_AMBULATORY_CARE_PROVIDER_SITE_OTHER): Payer: BC Managed Care – PPO | Admitting: Internal Medicine

## 2012-08-13 IMAGING — US US MISC SOFT TISSUE
1 series · 7 of 7 positions shown · non-contrast
Comparison: None.

CLINICAL DATA: Palpable soft tissue abnormality overlying the
sternum.

CHEST SOFT TISSUE ULTRASOUND

[Series 1: us misc soft tissue · 0.09mm/px · 7 acquisitions, 7 frames shown]
[im 1/7]
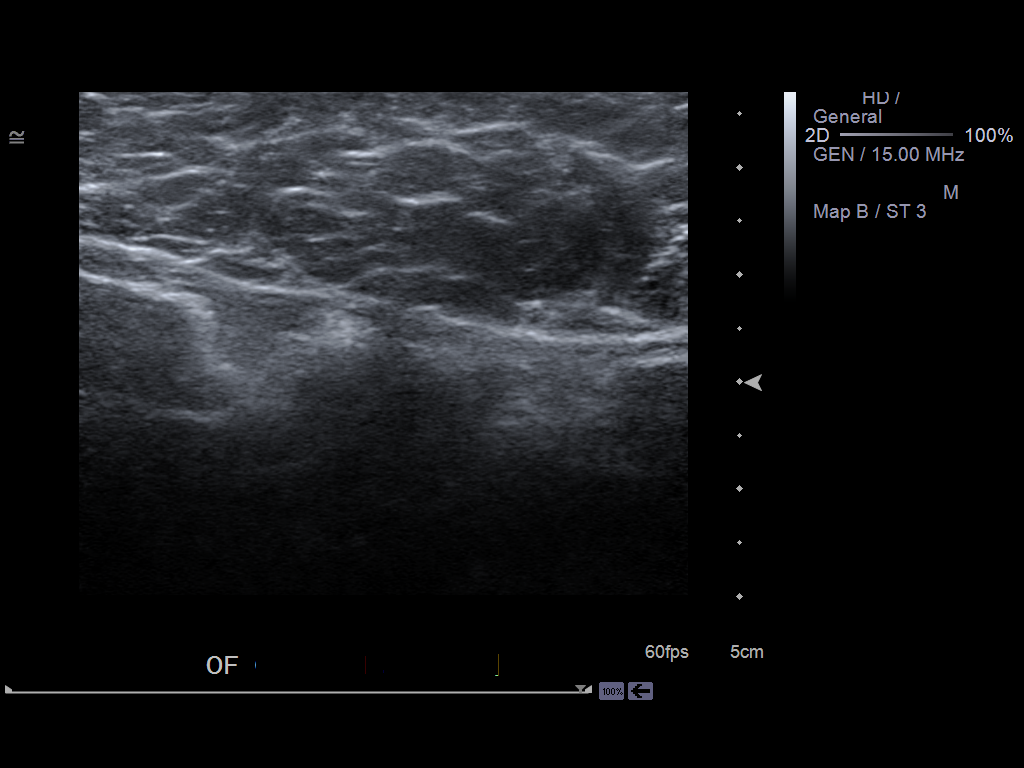
[im 2/7]
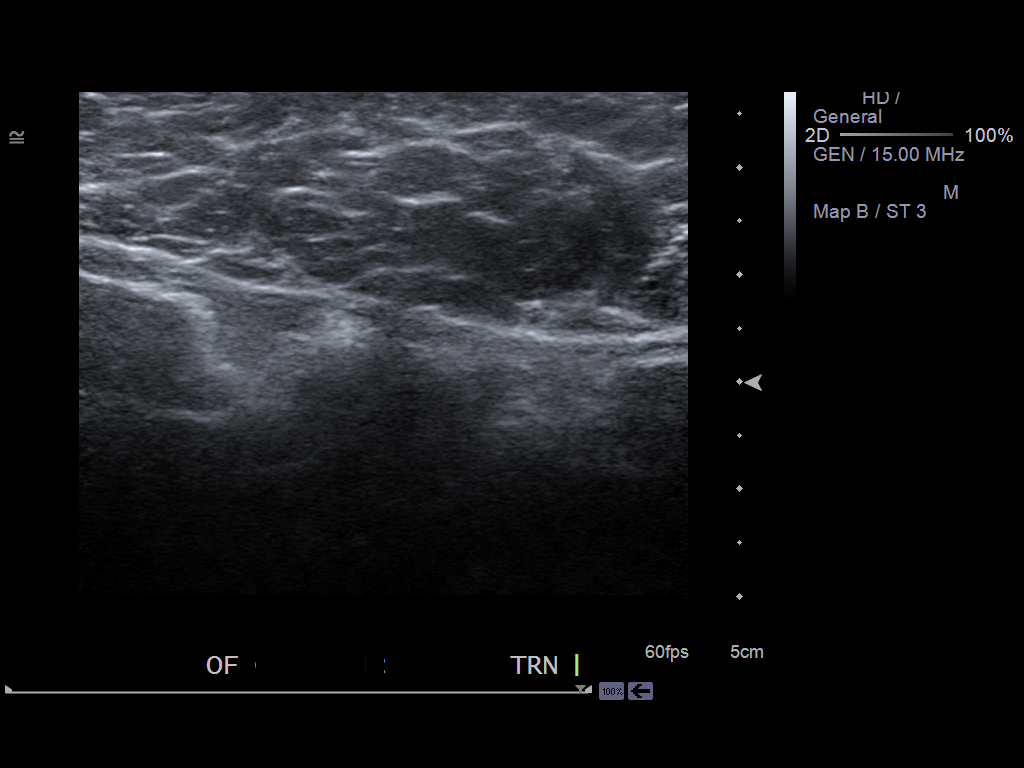
[im 3/7]
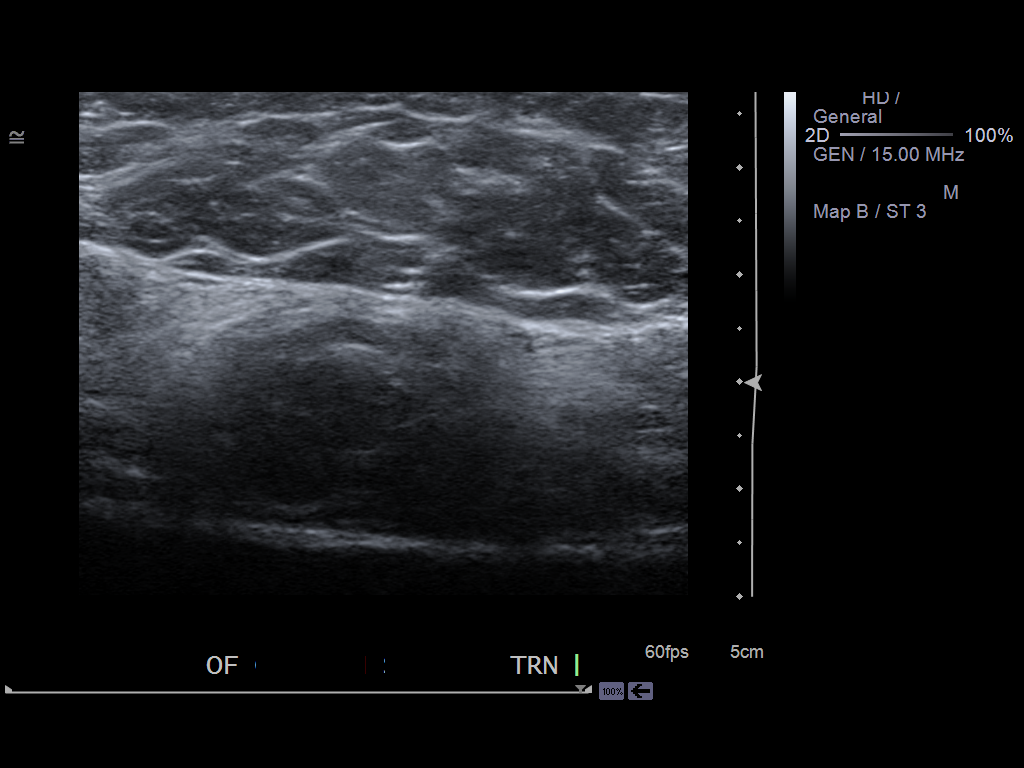
[im 4/7]
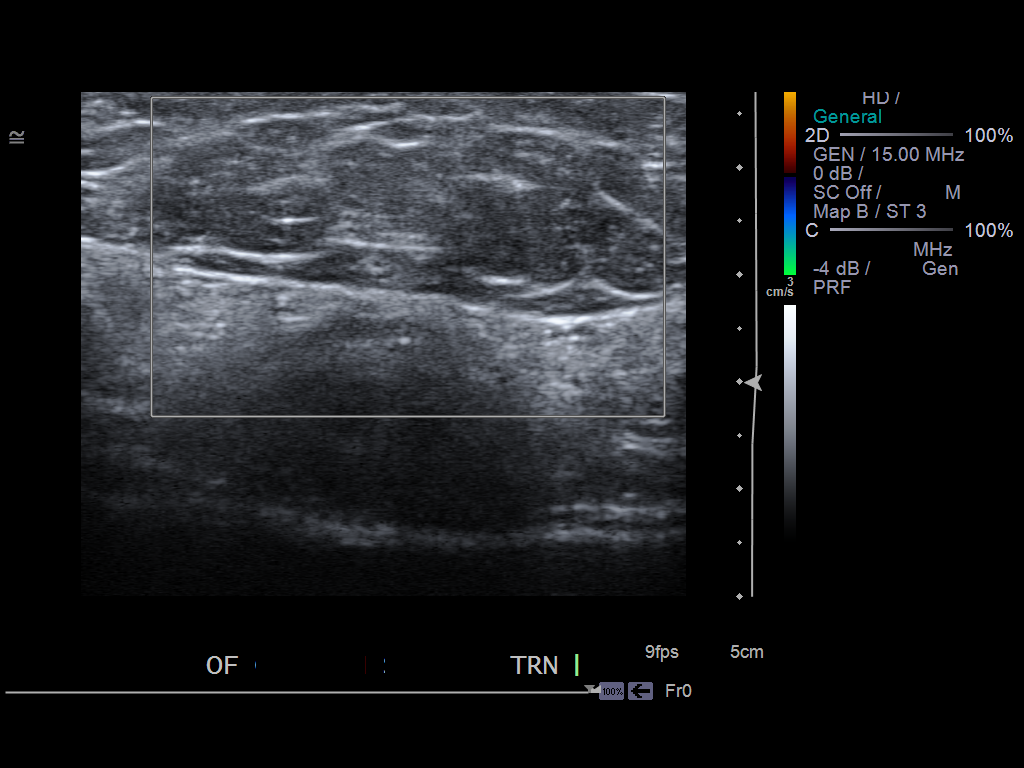
[im 5/7]
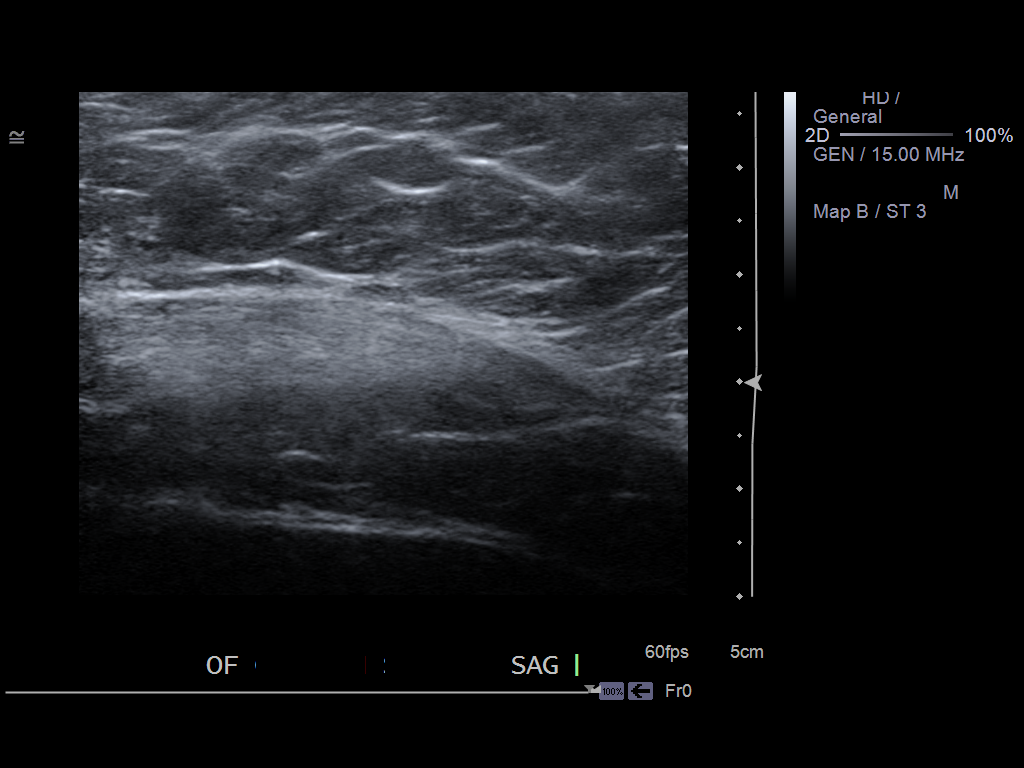
[im 6/7]
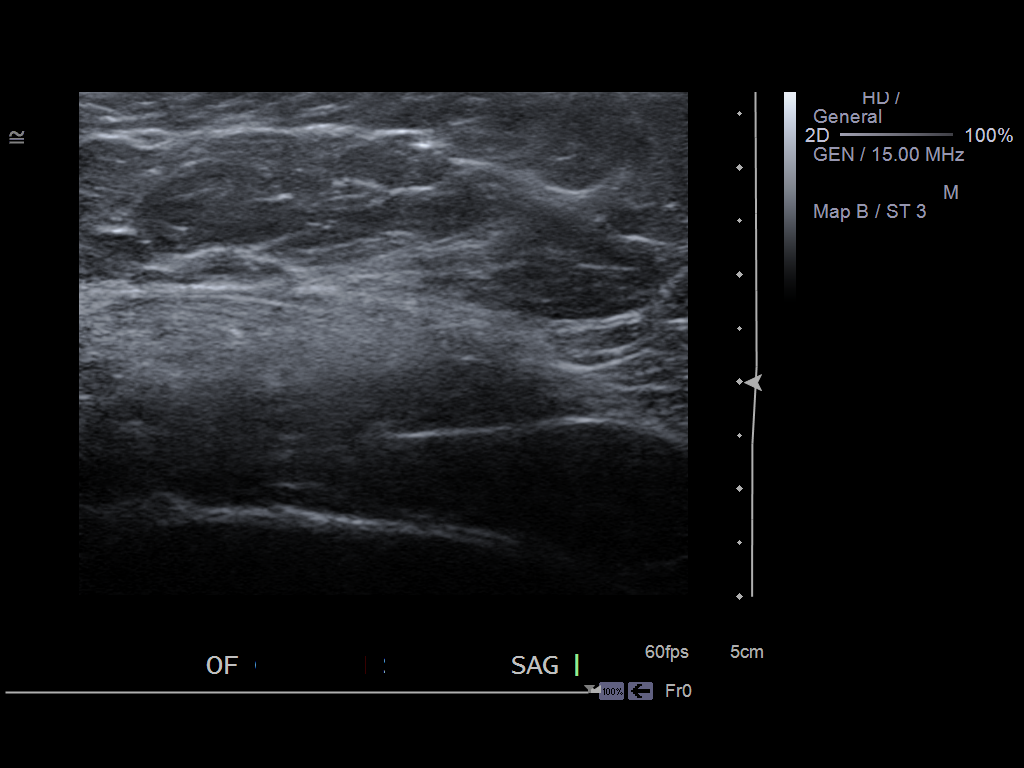
[im 7/7]
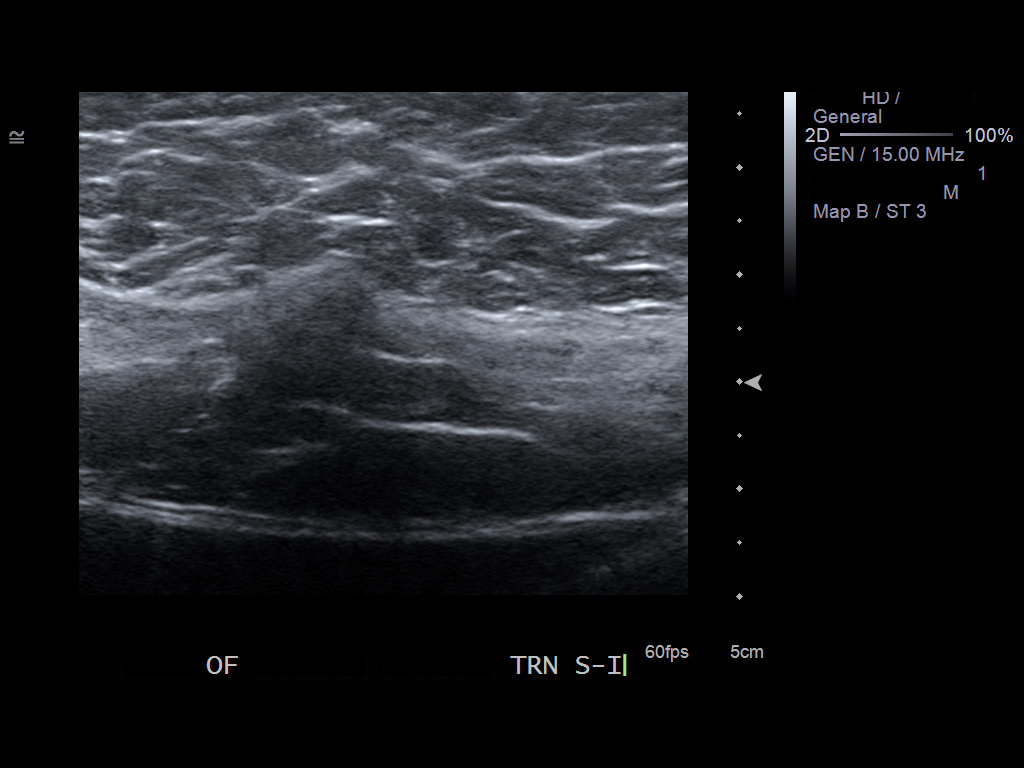

[7 of 7 positions shown; findings below may reference images not displayed]

FINDINGS: Ultrasound of the soft tissues overlying the region of
clinical concern shows normal fatty tissue.  No discrete mass
identified.  No abnormal calcifications.
IMPRESSION: Soft tissues overlying the sternum show normal fat by ultrasound.
No discrete mass identified.

## 2012-09-07 ENCOUNTER — Ambulatory Visit (INDEPENDENT_AMBULATORY_CARE_PROVIDER_SITE_OTHER): Payer: BC Managed Care – PPO | Admitting: Internal Medicine

## 2012-09-07 ENCOUNTER — Encounter (INDEPENDENT_AMBULATORY_CARE_PROVIDER_SITE_OTHER): Payer: Self-pay | Admitting: Internal Medicine

## 2012-09-07 VITALS — BP 118/76 | HR 80 | Temp 99.1°F | Resp 18 | Ht 64.0 in | Wt 232.4 lb

## 2012-09-07 DIAGNOSIS — K5190 Ulcerative colitis, unspecified, without complications: Secondary | ICD-10-CM

## 2012-09-07 NOTE — Patient Instructions (Signed)
Notify if symptoms relapse. Please remember to send a copy of your next blood work.

## 2012-09-07 NOTE — Progress Notes (Signed)
Presenting complaint;  Followup for ulcerative colitis.  Subjective:  Patient is 47 year old Caucasian female who is here for scheduled visit. She was last seen 4 months ago and was still having symptoms of active UC. She was switched from sulfasalazine and Asacol and also given 8 weeks of Uceris. She reports that her symptoms resolved within 2 weeks of treatment. She finished Uceris without any side effects. Now she remains on Delzicol. She feels fine. She is having one to 2 formed stools daily. She denies rectal bleeding abdominal pain nausea or vomiting. She has very good appetite. She has gained 3 pounds since her last visit. She will be getting her routine blood work later this year by her primary care physician.  Current Medications: Current Outpatient Prescriptions  Medication Sig Dispense Refill  . acetaminophen (TYLENOL) 500 MG tablet Take 500 mg by mouth as needed. Patient states that she uses very rarely for sinus      . ibuprofen (ADVIL,MOTRIN) 200 MG tablet Take 200 mg by mouth every 6 (six) hours as needed. For pain      . Mesalamine (DELZICOL PO) Take 400 mg by mouth. Patient states that she takes 3 by mouth twice daily      . Multiple Vitamin (MULITIVITAMIN WITH MINERALS) TABS Take 1 tablet by mouth daily.      Marland Kitchen SALINE NA Place into the nose as needed. Patient states that this is called a Sales executive      . Budesonide 9 MG TB24 Take 9 mg by mouth daily.  30 tablet  0   No current facility-administered medications for this visit.     Objective: Blood pressure 118/76, pulse 80, temperature 99.1 F (37.3 C), temperature source Oral, resp. rate 18, height 5\' 4"  (1.626 m), weight 232 lb 6.4 oz (105.416 kg), last menstrual period 08/28/2012. Conjunctiva is pink. Sclera is nonicteric Oropharyngeal mucosa is normal. No neck masses or thyromegaly noted. Abdomen is full but soft and nontender without organomegaly or masses.  No LE edema or clubbing noted.  Labs/studies  Results: CBC and CRP were normal in September 2013. H&H was 12.1 and 36.9 and CRP was less than 0.5.  Assessment:  #1. Chronic ulcerative colitis initially diagnosed in 1993. Last colonoscopy was in April 2013 revealing for active colitis at transverse descending and sigmoid colon. She appears to be in remission and tolerating mesalamine therapy without any side effects. . Plan:  Patient will get [place] a copy of her next blood work to be done later this year. Continue Delzicol at 1.2 g by mouth twice a day. Office visit in one year.

## 2012-11-08 ENCOUNTER — Ambulatory Visit (INDEPENDENT_AMBULATORY_CARE_PROVIDER_SITE_OTHER): Payer: BC Managed Care – PPO | Admitting: Nurse Practitioner

## 2012-11-08 ENCOUNTER — Encounter: Payer: Self-pay | Admitting: Nurse Practitioner

## 2012-11-08 VITALS — BP 114/80 | HR 87 | Temp 98.4°F | Resp 16 | Ht 68.0 in | Wt 237.0 lb

## 2012-11-08 DIAGNOSIS — J209 Acute bronchitis, unspecified: Secondary | ICD-10-CM

## 2012-11-08 MED ORDER — BENZONATATE 100 MG PO CAPS: ORAL_CAPSULE | ORAL | Status: DC

## 2012-11-08 MED ORDER — AZITHROMYCIN 250 MG PO TABS: ORAL_TABLET | ORAL | Status: DC

## 2012-11-08 NOTE — Progress Notes (Signed)
  Subjective:    Patient ID: [name], female    DOB: 02-17-1966, 47 y.o.   MRN: [id number]  Cough This is a new (pt states she gets a cough/bronchitis twice yearly.) problem. The current episode started 1 to 4 weeks ago (1 week). The problem has been gradually worsening. The problem occurs every few hours (mostly at night). The cough is productive of sputum. Associated symptoms include ear pain (few days, has resolved), nasal congestion and postnasal drip. Pertinent negatives include no chest pain, chills, ear congestion, eye redness, fever, headaches (sinius pressure), rash, sore throat, shortness of breath, sweats or weight loss. Nothing aggravates the symptoms. She has tried nothing for the symptoms. Her past medical history is significant for bronchitis and environmental allergies.      Review of Systems  Constitutional: Positive for fatigue. Negative for fever, chills, weight loss, activity change and appetite change.  HENT: Positive for ear pain (few days, has resolved), congestion, postnasal drip and sinus pressure. Negative for hearing loss, nosebleeds, sore throat, neck pain and neck stiffness.   Eyes: Negative for redness.  Respiratory: Positive for cough. Negative for chest tightness and shortness of breath.   Cardiovascular: Negative for chest pain.  Gastrointestinal: Positive for diarrhea. Negative for nausea, abdominal pain and abdominal distention.  Skin: Negative for rash.  Allergic/Immunologic: Positive for environmental allergies.  Neurological: Negative for headaches (sinius pressure).  Hematological: Negative for adenopathy.       Objective:   Physical Exam  Constitutional: She is oriented to person, place, and time. She appears well-developed and well-nourished. No distress.  HENT:  Head: Normocephalic and atraumatic.  Right Ear: External ear normal.  Left Ear: External ear normal.  Mouth/Throat: Oropharynx is clear and moist. No oropharyngeal exudate.   Eyes: Conjunctivae are normal. Right eye exhibits no discharge. Left eye exhibits no discharge.  Neck: Normal range of motion. Neck supple. No tracheal deviation present. No thyromegaly present.  Cardiovascular: Normal rate, regular rhythm and normal heart sounds.   No murmur heard. Pulmonary/Chest: Effort normal and breath sounds normal. No respiratory distress. She has no wheezes.  Coarse BS throughout  Musculoskeletal:  Normal gait  Lymphadenopathy:    She has no cervical adenopathy.  Neurological: She is alert and oriented to person, place, and time.  Skin: Skin is warm and dry.  Psychiatric: She has a normal mood and affect. Her behavior is normal. Thought content normal.          Assessment & Plan:  1. Acute bronchitis See pt instructions. Post-nasal drip appears to be contributing to cough. - benzonatate (TESSALON) 100 MG capsule; Use 1-2 capsules up to 3 times daily PRN cough.  Dispense: 60 capsule; Refill: 0 - azithromycin (ZITHROMAX) 250 MG tablet; Take 2 tabs po qd on day 1, then 1T PO QD ON DAYS 2-5.  Dispense: 6 tablet; Refill: 0

## 2012-11-08 NOTE — Patient Instructions (Signed)
I think you have viral bronchitis. I do not think an antibiotic will improve your symptoms, however, I will prescribe azithromycin. Please hold off filling for a few days and only if you are not improving. Start using pseudoephedrine for post-nasal drip. Also start rinsing sinuses daily. Hopefully this will result in less coughing. You may use benzonatate for cough. Please call us if you are not getting better within the week. You may cough for a total of 3-4 weeks, but fever, persistent fatigue, or chest pain indicates re-evaluation. Feel better!  Bronchitis Bronchitis is the body's way of reacting to injury and/or infection (inflammation) of the bronchi. Bronchi are the air tubes that extend from the windpipe into the lungs. If the inflammation becomes severe, it may cause shortness of breath. CAUSES  Inflammation may be caused by:  A virus.  Germs (bacteria).  Dust.  Allergens.  Pollutants and many other irritants. The cells lining the bronchial tree are covered with tiny hairs (cilia). These constantly beat upward, away from the lungs, toward the mouth. This keeps the lungs free of pollutants. When these cells become too irritated and are unable to do their job, mucus begins to develop. This causes the characteristic cough of bronchitis. The cough clears the lungs when the cilia are unable to do their job. Without either of these protective mechanisms, the mucus would settle in the lungs. Then you would develop pneumonia. Smoking is a common cause of bronchitis and can contribute to pneumonia. Stopping this habit is the single most important thing you can do to help yourself. TREATMENT   Your caregiver may prescribe an antibiotic if the cough is caused by bacteria. Also, medicines that open up your airways make it easier to breathe. Your caregiver may also recommend or prescribe an expectorant. It will loosen the mucus to be coughed up. Only take over-the-counter or prescription medicines for  pain, discomfort, or fever as directed by your caregiver.  Removing whatever causes the problem (smoking, for example) is critical to preventing the problem from getting worse.  Cough suppressants may be prescribed for relief of cough symptoms.  Inhaled medicines may be prescribed to help with symptoms now and to help prevent problems from returning.  For those with recurrent (chronic) bronchitis, there may be a need for steroid medicines. SEEK IMMEDIATE MEDICAL CARE IF:   During treatment, you develop more pus-like mucus (purulent sputum).  You have a fever.  Your baby is older than 3 months with a rectal temperature of 102 F (38.9 C) or higher.  Your baby is 53 months old or younger with a rectal temperature of 100.4 F (38 C) or higher.  You become progressively more ill.  You have increased difficulty breathing, wheezing, or shortness of breath. It is necessary to seek immediate medical care if you are elderly or sick from any other disease. MAKE SURE YOU:   Understand these instructions.  Will watch your condition.  Will get help right away if you are not doing well or get worse. Document Released: 02/03/2005 Document Revised: 04/28/2011 Document Reviewed: 12/14/2007 [facility] Patient Information 2014 [place], [place].

## 2012-12-23 ENCOUNTER — Other Ambulatory Visit: Payer: Self-pay

## 2013-05-09 ENCOUNTER — Other Ambulatory Visit (INDEPENDENT_AMBULATORY_CARE_PROVIDER_SITE_OTHER): Payer: Self-pay | Admitting: Internal Medicine

## 2013-05-18 ENCOUNTER — Encounter (INDEPENDENT_AMBULATORY_CARE_PROVIDER_SITE_OTHER): Payer: Self-pay | Admitting: *Deleted

## 2013-09-07 ENCOUNTER — Ambulatory Visit (INDEPENDENT_AMBULATORY_CARE_PROVIDER_SITE_OTHER): Payer: BC Managed Care – PPO | Admitting: Internal Medicine

## 2013-12-02 ENCOUNTER — Other Ambulatory Visit: Payer: Self-pay

## 2013-12-12 ENCOUNTER — Encounter (INDEPENDENT_AMBULATORY_CARE_PROVIDER_SITE_OTHER): Payer: Self-pay | Admitting: Internal Medicine

## 2013-12-12 ENCOUNTER — Ambulatory Visit (INDEPENDENT_AMBULATORY_CARE_PROVIDER_SITE_OTHER): Payer: BC Managed Care – PPO | Admitting: Internal Medicine

## 2013-12-12 VITALS — BP 120/74 | HR 78 | Temp 99.0°F | Resp 18 | Ht 64.0 in | Wt 245.8 lb

## 2013-12-12 DIAGNOSIS — K51911 Ulcerative colitis, unspecified with rectal bleeding: Secondary | ICD-10-CM

## 2013-12-12 MED ORDER — MESALAMINE 400 MG PO CPDR: 1200.0000 mg | DELAYED_RELEASE_CAPSULE | Freq: Two times a day (BID) | ORAL | Status: DC

## 2013-12-12 NOTE — Progress Notes (Signed)
Presenting complaint;  Followup for ulcerative colitis.  Subjective:  Patient is 48 year old Caucasian female who has history of ulcerative colitis dating back to 1993 and has been maintained on mesalamine. She was doing well when she was last July, 2014. Patient states she was in usual state of health until about 2 months ago when she developed abdominal cramps and rectal bleeding. She says she's been under a lot of stress because her youngest child is also going to school. Her husband who is a school principal was also transferred. She has noted resolution of her cramps over the last few days and she hasn't passed any blood in over 2 weeks. She did not experience nausea vomiting fever or chills. She remains with good appetite. There is no history of recent antibiotic use. She is not having any side effects with Delzicol.   Current Medications: Outpatient Encounter Prescriptions as of 12/12/2013  Medication Sig  . acetaminophen (TYLENOL) 500 MG tablet Take 500 mg by mouth as needed. Patient states that she uses very rarely for sinus  . DELZICOL 400 MG CPDR DR capsule take 3 capsules twice a day  . ibuprofen (ADVIL,MOTRIN) 200 MG tablet Take 200 mg by mouth every 6 (six) hours as needed. For pain  . Multiple Vitamin (MULITIVITAMIN WITH MINERALS) TABS Take 1 tablet by mouth daily.  . phenylephrine (SUDAFED PE) 10 MG TABS tablet Take 10 mg by mouth 2 (two) times daily as needed.  . ranitidine (ZANTAC) 150 MG capsule Take 150 mg by mouth as needed for heartburn.  Marland Kitchen. SALINE NA Place into the nose as needed. Patient states that this is called a Sales executiveettie Pot  . [DISCONTINUED] azithromycin (ZITHROMAX) 250 MG tablet Take 2 tabs po qd on day 1, then 1T PO QD ON DAYS 2-5.  . [DISCONTINUED] benzonatate (TESSALON) 100 MG capsule Use 1-2 capsules up to 3 times daily PRN cough.  . [DISCONTINUED] Mesalamine (DELZICOL PO) Take 400 mg by mouth. Patient states that she takes 3 by mouth twice daily     Objective: Blood pressure 120/74, pulse 78, temperature 99 F (37.2 C), temperature source Oral, resp. rate 18, height 5\' 4"  (1.626 m), weight 245 lb 12.8 oz (111.494 kg), last menstrual period 12/12/2013. Patient is alert and in no acute distress. Conjunctiva is pink. Sclera is nonicteric Oropharyngeal mucosa is normal. No neck masses or thyromegaly noted. Cardiac exam with regular rhythm normal S1 and S2. No murmur or gallop noted. Lungs are clear to auscultation. Abdomen is soft and nontender without organomegaly or masses.  No LE edema or clubbing noted.  Labs/studies Results: Last CBC normal on 11/04/2011.   Assessment:  #1. Chronic ulcerative colitis. It appears she may have experienced mild relapsed 2 months ago and now is feeling better. Flareup most likely secondary to stress at home as noted above. Last colonoscopy was in April 2013 revealing focal colitis at transverse, descending and sigmoid colon.   Plan:  Continue Delzicol at 500 mg by mouth twice a day. Patient will go to the lab for CBC, comprehensive chemistry panel, CRP and urinalysis. If CRP is elevated will increase Delzicol dose otherwise monitor patient's symptoms. Office visit in one year unless symptoms relapse. Next surveillance colonoscopy would be in April 2018.

## 2013-12-12 NOTE — Patient Instructions (Signed)
Physician Will call with results of blood work and urinalysis. Notify if rectal bleeding recurs.

## 2013-12-16 LAB — CBC
HCT: 40.4 % (ref 36.0–46.0)
Hemoglobin: 13.5 g/dL (ref 12.0–15.0)
MCH: 27.2 pg (ref 26.0–34.0)
MCHC: 33.4 g/dL (ref 30.0–36.0)
MCV: 81.5 fL (ref 78.0–100.0)
PLATELETS: 267 10*3/uL (ref 150–400)
RBC: 4.96 MIL/uL (ref 3.87–5.11)
RDW: 13.4 % (ref 11.5–15.5)
WBC: 7.7 10*3/uL (ref 4.0–10.5)

## 2013-12-16 LAB — COMPREHENSIVE METABOLIC PANEL
ALBUMIN: 4 g/dL (ref 3.5–5.2)
ALT: 19 U/L (ref 0–35)
AST: 14 U/L (ref 0–37)
Alkaline Phosphatase: 110 U/L (ref 39–117)
BUN: 11 mg/dL (ref 6–23)
CALCIUM: 8.8 mg/dL (ref 8.4–10.5)
CHLORIDE: 106 meq/L (ref 96–112)
CO2: 23 meq/L (ref 19–32)
CREATININE: 0.58 mg/dL (ref 0.50–1.10)
Glucose, Bld: 105 mg/dL — ABNORMAL HIGH (ref 70–99)
Potassium: 4.4 mEq/L (ref 3.5–5.3)
SODIUM: 136 meq/L (ref 135–145)
TOTAL PROTEIN: 6.5 g/dL (ref 6.0–8.3)
Total Bilirubin: 0.4 mg/dL (ref 0.2–1.2)

## 2013-12-16 LAB — C-REACTIVE PROTEIN

## 2013-12-17 LAB — URINALYSIS
Bilirubin Urine: NEGATIVE
Glucose, UA: NEGATIVE mg/dL
HGB URINE DIPSTICK: NEGATIVE
KETONES UR: NEGATIVE mg/dL
Leukocytes, UA: NEGATIVE
NITRITE: NEGATIVE
PROTEIN: NEGATIVE mg/dL
Specific Gravity, Urine: 1.02 (ref 1.005–1.030)
UROBILINOGEN UA: 0.2 mg/dL (ref 0.0–1.0)
pH: 7 (ref 5.0–8.0)

## 2014-05-05 ENCOUNTER — Encounter: Payer: Self-pay | Admitting: Family Medicine

## 2014-05-05 ENCOUNTER — Ambulatory Visit (INDEPENDENT_AMBULATORY_CARE_PROVIDER_SITE_OTHER): Payer: BC Managed Care – PPO | Admitting: Family Medicine

## 2014-05-05 VITALS — BP 139/88 | HR 90 | Temp 98.1°F | Ht 68.0 in | Wt 245.0 lb

## 2014-05-05 DIAGNOSIS — Z23 Encounter for immunization: Secondary | ICD-10-CM

## 2014-05-05 DIAGNOSIS — J018 Other acute sinusitis: Secondary | ICD-10-CM

## 2014-05-05 DIAGNOSIS — J208 Acute bronchitis due to other specified organisms: Secondary | ICD-10-CM

## 2014-05-05 MED ORDER — ALBUTEROL SULFATE HFA 108 (90 BASE) MCG/ACT IN AERS: 2.0000 | INHALATION_SPRAY | Freq: Four times a day (QID) | RESPIRATORY_TRACT | Status: DC | PRN

## 2014-05-05 MED ORDER — AZITHROMYCIN 250 MG PO TABS: ORAL_TABLET | ORAL | Status: DC

## 2014-05-05 MED ORDER — PREDNISONE 20 MG PO TABS: ORAL_TABLET | ORAL | Status: DC

## 2014-05-05 NOTE — Progress Notes (Signed)
Pre visit review using our clinic review tool, if applicable. No additional management support is needed unless otherwise documented below in the visit note. 

## 2014-05-05 NOTE — Addendum Note (Signed)
Addended by: Marlene LardMILLER, APRIL M on: 05/05/2014 12:01 PM   Modules accepted: Orders

## 2014-05-05 NOTE — Progress Notes (Signed)
OFFICE NOTE  05/05/2014  CC: "sinus sx's"  HPI: Patient is a 49 y.o. Caucasian female who is here for about 1 week hx of nasal congestion/drainage, sinus HA, cough, ears feel "like the ocean is in my head".  Cough has been progressing last 4d, feels tight in chest some last 24h.  No SOB.  No fevers.  Using alka seltzer cold plus some, sudafed PE some, neti pot some. She had similar illness in February this year that lasted 2 wks or so and she was well for a couple weeks before this one began.  Pertinent PMH:  Past medical, surgical, social, and family history reviewed and no changes are noted since last office visit.  MEDS:  Outpatient Prescriptions Prior to Visit  Medication Sig Dispense Refill  . acetaminophen (TYLENOL) 500 MG tablet Take 500 mg by mouth as needed. Patient states that she uses very rarely for sinus    . ibuprofen (ADVIL,MOTRIN) 200 MG tablet Take 200 mg by mouth every 6 (six) hours as needed. For pain    . Mesalamine (DELZICOL) 400 MG CPDR DR capsule Take 3 capsules (1,200 mg total) by mouth 2 (two) times daily. 180 capsule 11  . Multiple Vitamin (MULITIVITAMIN WITH MINERALS) TABS Take 1 tablet by mouth daily.    . phenylephrine (SUDAFED PE) 10 MG TABS tablet Take 10 mg by mouth 2 (two) times daily as needed.    . ranitidine (ZANTAC) 150 MG capsule Take 150 mg by mouth as needed for heartburn.    Marland Kitchen. SALINE NA Place into the nose as needed. Patient states that this is called a Nettie Pot     No facility-administered medications prior to visit.    PE: Blood pressure 139/88, pulse 90, temperature 98.1 F (36.7 C), temperature source Oral, height 5\' 8"  (1.727 m), weight 245 lb (111.131 kg), SpO2 97 %. VS: noted--normal. Gen: alert, NAD, NONTOXIC APPEARING. HEENT: eyes without injection, drainage, or swelling.  Ears: EACs clear, TMs with normal light reflex and landmarks.  Nose: Clear rhinorrhea, with some dried, crusty exudate adherent to mildly injected mucosa.  No  purulent d/c.  Mild R>L paranasal sinus TTP.  No facial swelling.  Throat and mouth without focal lesion.  No pharyngial swelling, erythema, or exudate.   Neck: supple, no LAD.   LUNGS: CTA bilat, nonlabored resps.  Some post-exhalation CV: RRR, no m/r/g. EXT: no c/c/e SKIN: no rash    IMPRESSION AND PLAN:  Acute sinusitis and bronchitis, subtle signs of RAD. She has had an albut inhaler in the past for similar illness and it helped. Will rx Proair HFA 1-2p q4h prn, prednisone 40mg  qd x 5d, and azith x 5d. Mucinex DM and saline nasal spray recommended for symptomatic care. Flu vaccine IM today.  An After Visit Summary was printed and given to the patient.  FOLLOW UP: prn

## 2014-09-20 ENCOUNTER — Encounter (INDEPENDENT_AMBULATORY_CARE_PROVIDER_SITE_OTHER): Payer: Self-pay | Admitting: *Deleted

## 2014-12-13 ENCOUNTER — Ambulatory Visit (INDEPENDENT_AMBULATORY_CARE_PROVIDER_SITE_OTHER): Payer: BC Managed Care – PPO | Admitting: Internal Medicine

## 2015-05-14 ENCOUNTER — Other Ambulatory Visit (INDEPENDENT_AMBULATORY_CARE_PROVIDER_SITE_OTHER): Payer: Self-pay | Admitting: Internal Medicine

## 2015-05-29 ENCOUNTER — Telehealth (INDEPENDENT_AMBULATORY_CARE_PROVIDER_SITE_OTHER): Payer: Self-pay | Admitting: *Deleted

## 2015-05-29 NOTE — Telephone Encounter (Signed)
A PA was completed for Delzicol 400 mg - Take 3 capsules twice daily. #180 refills to follow.  This was completed with Lauren @ [phone]. Approved for 1 year 05/29/2015-05/28/2016, Comfirmation # [id number]. A message was left on the patient's voicemail. Pharmacy also made aware.

## 2016-02-21 ENCOUNTER — Encounter: Payer: Self-pay | Admitting: Family Medicine

## 2016-02-21 ENCOUNTER — Ambulatory Visit (INDEPENDENT_AMBULATORY_CARE_PROVIDER_SITE_OTHER): Payer: BC Managed Care – PPO | Admitting: Family Medicine

## 2016-02-21 VITALS — BP 139/86 | HR 89 | Temp 98.4°F | Resp 20 | Wt 252.5 lb

## 2016-02-21 DIAGNOSIS — J0100 Acute maxillary sinusitis, unspecified: Secondary | ICD-10-CM

## 2016-02-21 MED ORDER — AZITHROMYCIN 250 MG PO TABS: ORAL_TABLET | ORAL | 0 refills | Status: DC

## 2016-02-21 MED ORDER — BENZONATATE 200 MG PO CAPS: 200.0000 mg | ORAL_CAPSULE | Freq: Two times a day (BID) | ORAL | 0 refills | Status: DC | PRN

## 2016-02-21 NOTE — Progress Notes (Signed)
Dannial MonarchMelodie J Leaming , 31-Jul-1965, 51 y.o., female MRN: [id number] Patient Care Team    Relationship Specialty Notifications Start End  Jeoffrey MassedPhilip H McGowen, MD PCP - General Family Medicine  09/07/12   Malissa HippoNajeeb U Rehman, MD Consulting Physician Gastroenterology  09/10/12     CC: cough  Subjective: Pt presents for an acute OV with complaints of cough  of 7 days duration.  Associated symptoms include chest congestion, cough, weak/fatigue, chills, head congestion  and decreased appetite. She has a h/o of frequent bronchitis. She denies nausea, vomit, diarrhea or myalgia.   Pt has tried mucinex DM, delsym and nyquil to ease their symptoms.  Allergic to PCN.   Allergies  Allergen Reactions  . Penicillins Rash and Other (See Comments)    Childhood allergy   Social History  Substance Use Topics  . Smoking status: Never Smoker  . Smokeless tobacco: Never Used  . Alcohol use No   Past Medical History:  Diagnosis Date  . Ulcerative colitis    diagnosed [id number]   Past Surgical History:  Procedure Laterality Date  . BLADDER REPAIR W/ CESAREAN SECTION     x 3   . COLONOSCOPY  06/13/2011   Procedure: COLONOSCOPY;  Surgeon: Malissa HippoNajeeb U Rehman, MD;  [facility];  Service: Endoscopy;  Laterality: N/A;  1030  . tib/fib repair from trauma     History reviewed. No pertinent family history. Allergies as of 02/21/2016      Reactions   Penicillins Rash, Other (See Comments)   Childhood allergy      Medication List       Accurate as of 02/21/16 10:48 AM. Always use your most recent med list.          acetaminophen 500 MG tablet Commonly known as:  TYLENOL Take 500 mg by mouth as needed. Patient states that she uses very rarely for sinus   albuterol 108 (90 Base) MCG/ACT inhaler Commonly known as:  PROAIR HFA Inhale 2 puffs into the lungs every 6 (six) hours as needed for wheezing or shortness of breath.   DELZICOL 400 MG Cpdr DR capsule Generic drug:  Mesalamine TAKE 3 CAPSULES  TWICE A DAY   ibuprofen 200 MG tablet Commonly known as:  ADVIL,MOTRIN Take 200 mg by mouth every 6 (six) hours as needed. For pain   multivitamin with minerals Tabs tablet Take 1 tablet by mouth daily.   ranitidine 150 MG capsule Commonly known as:  ZANTAC Take 150 mg by mouth as needed for heartburn.   SALINE NA Place into the nose as needed. Patient states that this is called a Sales executiveettie Pot       No results found for this or any previous visit (from the past 24 hour(s)). No results found.   ROS: Negative, with the exception of above mentioned in HPI   Objective:  BP 139/86 (BP Location: Right Arm, Patient Position: Sitting, Cuff Size: Large)   Pulse 89   Temp 98.4 F (36.9 C)   Resp 20   Wt 252 lb 8 oz (114.5 kg)   SpO2 98%   BMI 38.39 kg/m  Body mass index is 38.39 kg/m. Gen: Afebrile. No acute distress. Nontoxic in appearance, well developed, well nourished. Appears fatigued and congested. HENT: AT. Alpha. Bilateral TM visualized bilateral air-fluid levels. MMM, no oral lesions. Bilateral nares erythema, drainage. Throat without erythema or exudates. Cough, tender to palpation maxillary sinus. Eyes:Pupils Equal Round Reactive to light, Extraocular movements intact,  Conjunctiva without redness,  discharge or icterus. Neck/lymp/endocrine: Supple, no lymphadenopathy CV: RRR  Chest: CTAB, no wheeze or crackles. Good air movement, normal resp effort.  Neuro: Normal gait. PERLA. EOMi. Alert. Oriented x3   Assessment/Plan: MARIAISABEL BODIFORD is a 51 y.o. female present for acute OV for  Acute maxillary sinusitis, recurrence not specified - Rest, hydrate, humidifier use, Mucinex, Z-Pak, Tessalon Perles - azithromycin (ZITHROMAX) 250 MG tablet; 500 mg day 1, then 250 mg QD  Dispense: 6 tablet; Refill: 0  (PT request) - benzonatate (TESSALON) 200 MG capsule; Take 1 capsule (200 mg total) by mouth 2 (two) times daily as needed for cough.  Dispense: 20 capsule; Refill: 0 -  Follow-up when necessary   electronically signed by:  Felix Pacini, DO  [facility] - [place]

## 2016-02-21 NOTE — Patient Instructions (Signed)
I am treating you for a sinus infection with Z-pack and cough suppressant.  Use a humidifier, rest, hydrate, mucinex use.     Sinusitis, Adult Sinusitis is soreness and inflammation of your sinuses. Sinuses are hollow spaces in the bones around your face. They are located:  Around your eyes.  In the middle of your forehead.  Behind your nose.  In your cheekbones. Your sinuses and nasal passages are lined with a stringy fluid (mucus). Mucus normally drains out of your sinuses. When your nasal tissues get inflamed or swollen, the mucus can get trapped or blocked so air cannot flow through your sinuses. This lets bacteria, viruses, and funguses grow, and that leads to infection. Follow these instructions at home: Medicines  Take, use, or apply over-the-counter and prescription medicines only as told by your doctor. These may include nasal sprays.  If you were prescribed an antibiotic medicine, take it as told by your doctor. Do not stop taking the antibiotic even if you start to feel better. Hydrate and Humidify  Drink enough water to keep your pee (urine) clear or pale yellow.  Use a cool mist humidifier to keep the humidity level in your home above 50%.  Breathe in steam for 10-15 minutes, 3-4 times a day or as told by your doctor. You can do this in the bathroom while a hot shower is running.  Try not to spend time in cool or dry air. Rest  Rest as much as possible.  Sleep with your head raised (elevated).  Make sure to get enough sleep each night. General instructions  Put a warm, moist washcloth on your face 3-4 times a day or as told by your doctor. This will help with discomfort.  Wash your hands often with soap and water. If there is no soap and water, use hand sanitizer.  Do not smoke. Avoid being around people who are smoking (secondhand smoke).  Keep all follow-up visits as told by your doctor. This is important. Contact a doctor if:  You have a fever.  Your  symptoms get worse.  Your symptoms do not get better within 10 days. Get help right away if:  You have a very bad headache.  You cannot stop throwing up (vomiting).  You have pain or swelling around your face or eyes.  You have trouble seeing.  You feel confused.  Your neck is stiff.  You have trouble breathing. This information is not intended to replace advice given to you by your health care provider. Make sure you discuss any questions you have with your health care provider. Document Released: 07/23/2007 Document Revised: 09/30/2015 Document Reviewed: 11/29/2014 Elsevier Interactive Patient Education  2017 [employer]

## 2016-02-27 ENCOUNTER — Telehealth: Payer: Self-pay | Admitting: *Deleted

## 2016-02-27 NOTE — Telephone Encounter (Signed)
Spoke with patient reviewed information and instructions. Patient states she has been given second antibiotic in the past. Advised patient that is not recommended by Dr Claiborne BillingsKuneff. Patient advised to follow up if no improvement in 1 week.

## 2016-02-27 NOTE — Telephone Encounter (Signed)
Patient called and left a message stating she finished her Z-pak and is not any better. She is requesting another Z-pak be called in for her. She was seen on 02/21/16. Please advise.

## 2016-02-27 NOTE — Telephone Encounter (Signed)
Please call pt, inform her that the zpack is still in her system for 14 days after completion of abx. I would not recommend another zpack. I would reccommended if she is not improved in another week, she should be seen and I would  Prescribe a different abx if indicated by exam. Full resolution of symptoms can take a few weeks after completion of abx and if she is experiencing cough it can remain for 2-3 weeks after.

## 2016-07-09 ENCOUNTER — Telehealth (INDEPENDENT_AMBULATORY_CARE_PROVIDER_SITE_OTHER): Payer: Self-pay | Admitting: Internal Medicine

## 2016-07-09 ENCOUNTER — Other Ambulatory Visit (INDEPENDENT_AMBULATORY_CARE_PROVIDER_SITE_OTHER): Payer: Self-pay | Admitting: Internal Medicine

## 2016-07-09 NOTE — Telephone Encounter (Signed)
A refill has been called to the Pharmacy in [place].

## 2016-07-09 NOTE — Telephone Encounter (Signed)
Patient called, stated that she is out of town for a conference and is out of her Delzicol.  She called her pharmacy here to have it refilled where she's at, but the refill has expired.  [facility] would [facility] [place] at [phone].  [phone]

## 2016-10-27 ENCOUNTER — Encounter: Payer: Self-pay | Admitting: Family Medicine

## 2016-10-27 ENCOUNTER — Ambulatory Visit (INDEPENDENT_AMBULATORY_CARE_PROVIDER_SITE_OTHER): Payer: BC Managed Care – PPO | Admitting: Family Medicine

## 2016-10-27 VITALS — BP 127/90 | HR 82 | Temp 97.9°F | Resp 16 | Ht 64.0 in | Wt 244.5 lb

## 2016-10-27 DIAGNOSIS — Z0000 Encounter for general adult medical examination without abnormal findings: Secondary | ICD-10-CM | POA: Diagnosis not present

## 2016-10-27 DIAGNOSIS — Z111 Encounter for screening for respiratory tuberculosis: Secondary | ICD-10-CM | POA: Diagnosis not present

## 2016-10-27 DIAGNOSIS — Z23 Encounter for immunization: Secondary | ICD-10-CM

## 2016-10-27 NOTE — Progress Notes (Signed)
Office Note 10/27/2016  CC:  Chief Complaint  Patient presents with  . Annual Exam    Pt is not fasting.    HPI:  [name] is a 51 y.o. White female who is here for annual health maintenance exam. 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   Eyes: overdue for exam. Dental: preventatives UTD. Exercise: occasional.  No acute complaints.  Past Medical History:  Diagnosis Date  . Ulcerative colitis    diagnosed [id number]    Past Surgical History:  Procedure Laterality Date  . BLADDER REPAIR W/ CESAREAN SECTION     x 3   . COLONOSCOPY  06/13/2011   Procedure: COLONOSCOPY;  Surgeon: Rogene Houston, MD;  [facility];  Service: Endoscopy;  Laterality: N/A;  1030  . tib/fib repair from trauma      History reviewed. No pertinent family history.  Social History   Social History  . Marital status: Married    Spouse name: N/A  . Number of children: N/A  . Years of education: N/A   Occupational History  . Not on file.   Social History Main Topics  . Smoking status: Never Smoker  . Smokeless tobacco: Never Used  . Alcohol use No  . Drug use: No  . Sexual activity: Not on file   Other Topics Concern  . Not on file   Social History Narrative  . No narrative on file    Outpatient Medications Prior to Visit  Medication Sig Dispense Refill  . acetaminophen (TYLENOL) 500 MG tablet Take 500 mg by mouth as needed. Patient states that she uses very rarely for sinus    . albuterol (PROAIR HFA) 108 (90 BASE) MCG/ACT inhaler Inhale 2 puffs into the lungs every 6 (six) hours as needed for wheezing or shortness of breath. 1 Inhaler 1  . ibuprofen (ADVIL,MOTRIN) 200 MG tablet Take 200 mg by mouth every 6 (six) hours as needed. For pain    . Multiple Vitamin (MULITIVITAMIN WITH MINERALS) TABS Take 1 tablet by mouth daily.    . ranitidine (ZANTAC) 150 MG  capsule Take 150 mg by mouth as needed for heartburn.    Marland Kitchen SALINE NA Place into the nose as needed. Patient states that this is called a Health visitor    . azithromycin (ZITHROMAX) 250 MG tablet 500 mg day 1, then 250 mg QD (Patient not taking: Reported on 10/27/2016) 6 tablet 0  . benzonatate (TESSALON) 200 MG capsule Take 1 capsule (200 mg total) by mouth 2 (two) times daily as needed for cough. (Patient not taking: Reported on 10/27/2016) 20 capsule 0  . DELZICOL 400 MG CPDR DR capsule take 3 capsule by mouth twice a day (Patient not taking: Reported on 10/27/2016) 180 capsule 5   No facility-administered medications prior to visit.     Allergies  Allergen Reactions  . Penicillins Rash and Other (See Comments)    Childhood allergy    ROS Review of Systems  Constitutional: Negative for appetite change, chills, fatigue and fever.  HENT: Negative for congestion, dental problem, ear pain and sore throat.   Eyes: Negative for discharge, redness and visual disturbance.  Respiratory: Negative for cough, chest tightness, shortness of breath and wheezing.   Cardiovascular: Negative for chest pain, palpitations and leg swelling.  Gastrointestinal: Negative for abdominal pain, blood in stool, diarrhea, nausea and  vomiting.  Genitourinary: Negative for difficulty urinating, dysuria, flank pain, frequency, hematuria and urgency.  Musculoskeletal: Negative for arthralgias, back pain, joint swelling, myalgias and neck stiffness.  Skin: Negative for pallor and rash.  Neurological: Negative for dizziness, speech difficulty, weakness and headaches.  Hematological: Negative for adenopathy. Does not bruise/bleed easily.  Psychiatric/Behavioral: Negative for confusion and sleep disturbance. The patient is not nervous/anxious.     PE; Blood pressure 127/90, pulse 82, temperature 97.9 F (36.6 C), temperature source Oral, resp. rate 16, height _0  (1.626 m), weight 244 lb 8 oz (110.9 kg), last menstrual  period 10/08/2016, SpO2 97 %. Body mass index is 41.97 kg/m.  Gen: Alert, well appearing.  Patient is oriented to person, place, time, and situation. AFFECT: pleasant, lucid thought and speech. ENT: Ears: EACs clear, normal epithelium.  TMs with good light reflex and landmarks bilaterally.  Eyes: no injection, icteris, swelling, or exudate.  EOMI, PERRLA. Nose: no drainage or turbinate edema/swelling.  No injection or focal lesion.  Mouth: lips without lesion/swelling.  Oral mucosa pink and moist.  Dentition intact and without obvious caries or gingival swelling.  Oropharynx without erythema, exudate, or swelling.  Neck: supple/nontender.  No LAD, mass, or TM.  Carotid pulses 2+ bilaterally, without bruits. CV: RRR, no m/r/g.   LUNGS: CTA bilat, nonlabored resps, good aeration in all lung fields. ABD: soft, NT, ND, BS normal.  No hepatospenomegaly or mass.  No bruits. EXT: no clubbing, cyanosis, or edema.  Musculoskeletal: no joint swelling, erythema, warmth, or tenderness.  ROM of all joints intact. Skin - no sores or suspicious lesions or rashes or color changes  Pertinent labs:  No results found for: TSH Lab Results  Component Value Date   WBC 7.7 12/16/2013   HGB 13.5 12/16/2013   HCT 40.4 12/16/2013   MCV 81.5 12/16/2013   PLT 267 12/16/2013   Lab Results  Component Value Date   CREATININE 0.58 12/16/2013   BUN 11 12/16/2013   NA 136 12/16/2013   K 4.4 12/16/2013   CL 106 12/16/2013   CO2 23 12/16/2013   Lab Results  Component Value Date   ALT 19 12/16/2013   AST 14 12/16/2013   ALKPHOS 110 12/16/2013   BILITOT 0.4 12/16/2013   No results found for: CHOL No results found for: HDL No results found for: LDLCALC No results found for: TRIG No results found for: CHOLHDL   ASSESSMENT AND PLAN:   Health maintenance exam: Reviewed age and gender appropriate health maintenance issues (prudent diet, regular exercise, health risks of tobacco and excessive alcohol, use of  seatbelts, fire alarms in home, use of sunscreen).  Also reviewed age and gender appropriate health screening as well as vaccine recommendations. Vaccines: no records--will give Tdap today.  Unsure Hep B vaccination status---check Hep B surface antibody level today. Also, check MMR titers today.  TB skin test placed today.  Return in 48-72 h to get this read. If not hep B immune, we'll give her Hep B vaccine #1 when she returns to get her TB skin test read. Lab: Will ask Dr. Laural Golden to add FLP and TSH to the labs he'll be getting on her at upcoming f/u visit soon. GYN: [facility] : she is due for appt for pap/pelvic/mammogram and she'll make appt with their office. Colon ca screening: last colonoscopy 2013.  With hx of ulcerative colitis, she is due for repeat this year--pt has appt with Dr. Laural Golden soon.  An After Visit Summary was printed  and given to the patient.  FOLLOW UP:  Return in about 1 year (around 10/27/2017) for annual CPE (fasting).  Signed:  Crissie Sickles, MD           10/27/2016

## 2016-10-27 NOTE — Patient Instructions (Signed)

## 2016-10-27 NOTE — Addendum Note (Signed)
Addended by: Regan RakersAY, Erman Thum K on: 10/27/2016 04:30 PM   Modules accepted: Orders

## 2016-10-28 ENCOUNTER — Ambulatory Visit (INDEPENDENT_AMBULATORY_CARE_PROVIDER_SITE_OTHER): Payer: BC Managed Care – PPO | Admitting: Internal Medicine

## 2016-10-28 LAB — HEPATITIS B SURFACE ANTIBODY,QUALITATIVE: HEP B S AB: NONREACTIVE

## 2016-10-28 LAB — MEASLES/MUMPS/RUBELLA IMMUNITY
RUBELLA: 1.77 {index}
RUBEOLA IGG: 161 [AU]/ml

## 2016-10-29 ENCOUNTER — Ambulatory Visit (INDEPENDENT_AMBULATORY_CARE_PROVIDER_SITE_OTHER): Payer: BC Managed Care – PPO | Admitting: *Deleted

## 2016-10-29 DIAGNOSIS — Z23 Encounter for immunization: Secondary | ICD-10-CM

## 2016-10-29 LAB — TB SKIN TEST
INDURATION: 0 mm
TB SKIN TEST: NEGATIVE

## 2017-01-23 ENCOUNTER — Telehealth: Payer: Self-pay | Admitting: *Deleted

## 2017-01-23 NOTE — Telephone Encounter (Signed)
Copied from CRM [phone]. Topic: Inquiry >> Jan 22, 2017  3:58 PM Raquel SarnaHayes, Teresa G wrote: Pt called earlier this week with no reply. Pt is needing a 2nd round of booster shots. She isn't sure of what shots she needs done.   She has time in the morning to receive the shots. Pt has a meeting  today until  6pm.  Please leave what shots she needs and when she can come in in the morning if possible, on her cell voicemail

## 2017-01-23 NOTE — Telephone Encounter (Signed)
Yes, she can come in at any time now and get Hep B#2. She does NOT need another MMR, though. --thx

## 2017-01-23 NOTE — Telephone Encounter (Signed)
Pt was given MMR and Hep B on 10/29/16. Is she due for her next dose? Please advise. Thanks.

## 2017-01-28 NOTE — Telephone Encounter (Signed)
Pt advised and voiced understanding. Nurse visit made for 02/04/17 at 3:30pm.

## 2017-02-04 ENCOUNTER — Ambulatory Visit (INDEPENDENT_AMBULATORY_CARE_PROVIDER_SITE_OTHER): Payer: BC Managed Care – PPO | Admitting: *Deleted

## 2017-02-04 DIAGNOSIS — Z23 Encounter for immunization: Secondary | ICD-10-CM

## 2017-06-19 ENCOUNTER — Ambulatory Visit: Payer: BC Managed Care – PPO

## 2017-07-01 ENCOUNTER — Other Ambulatory Visit (INDEPENDENT_AMBULATORY_CARE_PROVIDER_SITE_OTHER): Payer: Self-pay | Admitting: Internal Medicine

## 2017-07-02 NOTE — Telephone Encounter (Signed)
Patient needs office visit prior to further refills 

## 2017-07-02 NOTE — Telephone Encounter (Signed)
Noted  

## 2017-07-02 NOTE — Telephone Encounter (Signed)
I will call her.

## 2017-07-02 NOTE — Telephone Encounter (Signed)
Called patient to set up appointment - no answer

## 2017-07-07 ENCOUNTER — Encounter: Payer: Self-pay | Admitting: Family Medicine

## 2017-07-07 ENCOUNTER — Ambulatory Visit (INDEPENDENT_AMBULATORY_CARE_PROVIDER_SITE_OTHER): Payer: BC Managed Care – PPO

## 2017-07-07 DIAGNOSIS — Z23 Encounter for immunization: Secondary | ICD-10-CM | POA: Diagnosis not present

## 2017-07-07 NOTE — Progress Notes (Signed)
Patient presents today for Hep B #3. Given with no incident or problem. Patient left with no complaints.

## 2018-11-03 ENCOUNTER — Telehealth: Payer: Self-pay

## 2018-11-03 NOTE — Telephone Encounter (Signed)
Received voicemail that pt was requesting refill for med prescribed by another doctor. She did not provide the name of med. Mesalamine was given by Dr.Rehman 07/02/17 (120,2).

## 2018-11-08 ENCOUNTER — Ambulatory Visit
Admission: EM | Admit: 2018-11-08 | Discharge: 2018-11-08 | Disposition: A | Discharge status: Home / Self Care | Payer: 59 | Attending: Emergency Medicine | Admitting: Emergency Medicine

## 2018-11-08 ENCOUNTER — Other Ambulatory Visit: Payer: Self-pay

## 2018-11-08 DIAGNOSIS — Z711 Person with feared health complaint in whom no diagnosis is made: Secondary | ICD-10-CM | POA: Diagnosis not present

## 2018-11-08 NOTE — ED Triage Notes (Signed)
Needs covid testing for travel

## 2018-11-08 NOTE — ED Provider Notes (Signed)
[name]   [id number] 11/08/18 Arrival Time: 5035   CC: COVID testing  SUBJECTIVE: History from: patient.  [name] is a 53 y.o. female who presents for COVID testing.  Denies sick exposure to COVID, flu or strep.  Denies recent travel.  Denies aggravating or alleviating symptoms.  Denies previous COVID infection.   Denies fever, chills, fatigue, nasal congestion, rhinorrhea, sore throat, cough, SOB, wheezing, chest pain, nausea, vomiting, changes in bowel or bladder habits.    ROS: As per HPI.  All other pertinent ROS negative.     Past Medical History:  Diagnosis Date  . Ulcerative colitis    diagnosed [id number]   Past Surgical History:  Procedure Laterality Date  . BLADDER REPAIR W/ CESAREAN SECTION     x 3   . COLONOSCOPY  06/13/2011   Procedure: COLONOSCOPY;  Surgeon: Rogene Houston, MD;  [facility];  Service: Endoscopy;  Laterality: N/A;  1030  . tib/fib repair from trauma     Allergies  Allergen Reactions  . Penicillins Rash and Other (See Comments)    Childhood allergy   No current facility-administered medications on file prior to encounter.    Current Outpatient Medications on File Prior to Encounter  Medication Sig Dispense Refill  . ranitidine (ZANTAC) 150 MG capsule Take 150 mg by mouth as needed for heartburn.    Marland Kitchen acetaminophen (TYLENOL) 500 MG tablet Take 500 mg by mouth as needed. Patient states that she uses very rarely for sinus    . ibuprofen (ADVIL,MOTRIN) 200 MG tablet Take 200 mg by mouth every 6 (six) hours as needed. For pain    . mesalamine (LIALDA) 1.2 g EC tablet TAKE 2 TABLETS BY MOUTH TWICE DAILY 120 tablet 2  . Multiple Vitamin (MULITIVITAMIN WITH MINERALS) TABS Take 1 tablet by mouth daily.    Marland Kitchen SALINE NA Place into the nose as needed. Patient states that this is called a Health visitor    . [DISCONTINUED] albuterol (PROAIR HFA) 108 (90 BASE) MCG/ACT inhaler Inhale 2 puffs into the lungs every 6 (six) hours as  needed for wheezing or shortness of breath. 1 Inhaler 1   Social History   Socioeconomic History  . Marital status: Married    Spouse name: Not on file  . Number of children: Not on file  . Years of education: Not on file  . Highest education level: Not on file  Occupational History  . Not on file  Social Needs  . Financial resource strain: Not on file  . Food insecurity    Worry: Not on file    Inability: Not on file  . Transportation needs    Medical: Not on file    Non-medical: Not on file  Tobacco Use  . Smoking status: Never Smoker  . Smokeless tobacco: Never Used  Substance and Sexual Activity  . Alcohol use: No  . Drug use: No  . Sexual activity: Not on file  Lifestyle  . Physical activity    Days per week: Not on file    Minutes per session: Not on file  . Stress: Not on file  Relationships  . Social Herbalist on phone: Not on file    Gets together: Not on file    Attends religious service: Not on file    Active member of club or organization: Not on file    Attends meetings of clubs or organizations: Not on file    Relationship status:  Not on file  . Intimate partner violence    Fear of current or ex partner: Not on file    Emotionally abused: Not on file    Physically abused: Not on file    Forced sexual activity: Not on file  Other Topics Concern  . Not on file  Social History Narrative  . Not on file   History reviewed. No pertinent family history.  OBJECTIVE:  Vitals:   11/08/18 1448  BP: (!) 146/91  Pulse: 91  Resp: 18  Temp: 98.4 F (36.9 C)  SpO2: 95%     General appearance: alert; well-appearing; speaking in full sentences and tolerating own secretions HEENT: NCAT; Ears: EACs clear, TMs pearly gray; Eyes: PERRL.  EOM grossly intact. Nose: nares patent without rhinorrhea, Throat: oropharynx clear, tonsils non erythematous or enlarged, uvula midline  Neck: supple without LAD Lungs: unlabored respirations, symmetrical air  entry; cough: absent; no respiratory distress; CTAB Heart: regular rate and rhythm.  Radial pulses 2+ symmetrical bilaterally Skin: warm and dry Psychological: alert and cooperative; normal mood and affect  ASSESSMENT & PLAN:  1. Worried well     COVID testing ordered.  It will take between 5-7 days for results.  Someone will contact you with abnormal results.    In the meantime: You should remain isolated in your home for 10 days if you develop symptoms AND greater than 72 hours after symptoms resolution (absence of fever without the use of fever-reducing medication and improvement in respiratory symptoms), whichever is longer Get plenty of rest and push fluids Follow up with PCP as needed Call or go to the ED if you have any new or worsening symptoms such as fever, worsening cough, shortness of breath, chest tightness, chest pain, turning blue, changes in mental status, etc...  Reviewed expectations re: course of current medical issues. Questions answered. Outlined signs and symptoms indicating need for more acute intervention. Patient verbalized understanding. After Visit Summary given.         [name], PA-C 11/08/18 1507

## 2018-11-08 NOTE — Discharge Instructions (Signed)
COVID testing ordered.  It will take between 5-7 days for results.  Someone will contact you with abnormal results.    In the meantime: You should remain isolated in your home for 10 days if you develop symptoms AND greater than 72 hours after symptoms resolution (absence of fever without the use of fever-reducing medication and improvement in respiratory symptoms), whichever is longer Get plenty of rest and push fluids Follow up with PCP as needed Call or go to the ED if you have any new or worsening symptoms such as fever, worsening cough, shortness of breath, chest tightness, chest pain, turning blue, changes in mental status, etc..Marland Kitchen

## 2018-11-09 ENCOUNTER — Other Ambulatory Visit: Payer: Self-pay

## 2018-11-09 DIAGNOSIS — Z20822 Contact with and (suspected) exposure to covid-19: Secondary | ICD-10-CM

## 2018-11-10 ENCOUNTER — Encounter (HOSPITAL_COMMUNITY): Payer: Self-pay

## 2018-11-10 LAB — NOVEL CORONAVIRUS, NAA
SARS-CoV-2, NAA: NOT DETECTED
SARS-CoV-2, NAA: NOT DETECTED

## 2018-11-15 ENCOUNTER — Other Ambulatory Visit: Payer: Self-pay

## 2018-11-15 ENCOUNTER — Ambulatory Visit: Admission: EM | Admit: 2018-11-15 | Discharge: 2018-11-15 | Discharge status: Against Medical Advice | Payer: 59

## 2018-11-15 DIAGNOSIS — U071 COVID-19: Secondary | ICD-10-CM

## 2018-11-16 ENCOUNTER — Other Ambulatory Visit: Payer: Self-pay

## 2018-11-16 DIAGNOSIS — Z20822 Contact with and (suspected) exposure to covid-19: Secondary | ICD-10-CM

## 2018-11-16 LAB — NOVEL CORONAVIRUS, NAA: SARS-CoV-2, NAA: NOT DETECTED

## 2018-11-17 LAB — NOVEL CORONAVIRUS, NAA: SARS-CoV-2, NAA: NOT DETECTED

## 2020-09-15 ENCOUNTER — Other Ambulatory Visit: Payer: Self-pay

## 2020-09-15 ENCOUNTER — Encounter: Payer: Self-pay | Admitting: Emergency Medicine

## 2020-09-15 ENCOUNTER — Ambulatory Visit
Admission: EM | Admit: 2020-09-15 | Discharge: 2020-09-15 | Disposition: A | Discharge status: Home / Self Care | Payer: 59

## 2020-09-15 DIAGNOSIS — L304 Erythema intertrigo: Secondary | ICD-10-CM

## 2020-09-15 MED ORDER — FLUCONAZOLE 200 MG PO TABS: ORAL_TABLET | ORAL | 0 refills | Status: AC

## 2020-09-15 NOTE — ED Triage Notes (Signed)
Pt presents today with c/o of rash under breasts with itching x 5 days.

## 2020-09-15 NOTE — ED Provider Notes (Signed)
[facility]   [id number] 09/15/20 Arrival Time: 1423  CC: Rash  SUBJECTIVE:  [name] is a 55 y.o. female who presents with a rash to underneath LT breast x 5 days.  Reports hx of yeast rash.  Localizes the rash to underneath LT breast.  Describes it as itchy and red  Has tried OTC monistat cream with minimal relief.  Symptoms are made worse with heat.  Reports similar symptoms in the past that improved with diflucan.   Denies fever, chills, nausea, vomiting, swelling, discharge.    ROS: As per HPI.  All other pertinent ROS negative.     Past Medical History:  Diagnosis Date   Ulcerative colitis    diagnosed in1993   Past Surgical History:  Procedure Laterality Date   BLADDER REPAIR W/ CESAREAN SECTION     x 3    COLONOSCOPY  06/13/2011   Procedure: COLONOSCOPY;  Surgeon: Malissa Hippo, MD;  Location: [facility];  Service: Endoscopy;  Laterality: N/A;  1030   tib/fib repair from trauma     Allergies  Allergen Reactions   Penicillins Rash and Other (See Comments)    Childhood allergy   No current facility-administered medications on file prior to encounter.   Current Outpatient Medications on File Prior to Encounter  Medication Sig Dispense Refill   pseudoephedrine (SUDAFED) 30 MG tablet Take 30 mg by mouth every 4 (four) hours as needed for congestion.     acetaminophen (TYLENOL) 500 MG tablet Take 500 mg by mouth as needed. Patient states that she uses very rarely for sinus     ibuprofen (ADVIL,MOTRIN) 200 MG tablet Take 200 mg by mouth every 6 (six) hours as needed. For pain     mesalamine (LIALDA) 1.2 g EC tablet TAKE 2 TABLETS BY MOUTH TWICE DAILY 120 tablet 2   Multiple Vitamin (MULITIVITAMIN WITH MINERALS) TABS Take 1 tablet by mouth daily.     ranitidine (ZANTAC) 150 MG capsule Take 150 mg by mouth as needed for heartburn.     SALINE NA Place into the nose as needed. Patient states that this is called a Sales executive     [DISCONTINUED] albuterol  (PROAIR HFA) 108 (90 BASE) MCG/ACT inhaler Inhale 2 puffs into the lungs every 6 (six) hours as needed for wheezing or shortness of breath. 1 Inhaler 1   Social History   Socioeconomic History   Marital status: Married    Spouse name: Not on file   Number of children: Not on file   Years of education: Not on file   Highest education level: Not on file  Occupational History   Not on file  Tobacco Use   Smoking status: Never   Smokeless tobacco: Never  Substance and Sexual Activity   Alcohol use: No   Drug use: No   Sexual activity: Not on file  Other Topics Concern   Not on file  Social History Narrative   Not on file   Social Determinants of Health   Financial Resource Strain: Not on file  Food Insecurity: Not on file  Transportation Needs: Not on file  Physical Activity: Not on file  Stress: Not on file  Social Connections: Not on file  Intimate Partner Violence: Not on file   History reviewed. No pertinent family history.  OBJECTIVE: Vitals:   09/15/20 1445  BP: 127/72  Pulse: 96  Resp: 18  Temp: 98.7 F (37.1 C)  TempSrc: Oral  SpO2: 98%    General  appearance: alert; no distress Head: NCAT Lungs: normal respiratory effort Extremities: no edema Skin: warm and dry; moist, red or red-brown, beefy, homogenous patches located underneath left breast; some satellite lesions  Psychological: alert and cooperative; normal mood and affect  ASSESSMENT & PLAN:  1. Intertrigo     Meds ordered this encounter  Medications   fluconazole (DIFLUCAN) 200 MG tablet    Sig: Take one dose by mouth, wait 72 hours, and then take second dose by mouth    Dispense:  2 tablet    Refill:  0    Order Specific Question:   Supervising Provider    Answer:   Eustace Moore [[id number]]    Daily cleansing of skin folds with a mild cleanser followed by drying of affected area with a soft cloth or hair dryer on a cool setting Airing of affected area when feasible Daily application  of drying powders Use of absorbent material or clothing, such as cotton or merino wool, to separate skin in folds Diflucan prescribed Follow up with PCP as needed Go to the ED if the patient has any new or worsening symptoms such as fever, chills, decreased appetite, decreased activity, drooling, vomiting, wheezing, rash, changes in bowel or bladder function, etc...   Reviewed expectations re: course of current medical issues. Questions answered. Outlined signs and symptoms indicating need for more acute intervention. Patient verbalized understanding. After Visit Summary given.    [name], PA-C 09/15/20 1510

## 2020-09-15 NOTE — Discharge Instructions (Addendum)
Daily cleansing of skin folds with a mild cleanser followed by drying of affected area with a soft cloth or hair dryer on a cool setting Airing of affected area when feasible Daily application of drying powders Use of absorbent material or clothing, such as cotton or merino wool, to separate skin in folds Diflucan prescribed Follow up with PCP as needed Go to the ED if the patient has any new or worsening symptoms such as fever, chills, decreased appetite, decreased activity, drooling, vomiting, wheezing, rash, changes in bowel or bladder function, etc..Marland Kitchen

## 2021-01-03 ENCOUNTER — Telehealth: Payer: Self-pay

## 2021-01-03 NOTE — Telephone Encounter (Signed)
Attempted to contact pt and was unsuccessful. Called to schedule CPE

## 2021-09-18 ENCOUNTER — Telehealth: Payer: Self-pay

## 2021-09-18 NOTE — Telephone Encounter (Signed)
Left message I called 

## 2021-09-18 NOTE — Telephone Encounter (Signed)
Pt stated that she will not be coming to get the item that you have for her she states she is fine
